# Patient Record
Sex: Male | Born: 1985
Health system: Southern US, Community
[De-identification: ages and names within clinical notes are randomized; demographics above are authoritative.]

## PROBLEM LIST (undated history)

## (undated) DIAGNOSIS — E739 Lactose intolerance, unspecified: Secondary | ICD-10-CM

## (undated) DIAGNOSIS — I10 Essential (primary) hypertension: Secondary | ICD-10-CM

## (undated) DIAGNOSIS — R7303 Prediabetes: Secondary | ICD-10-CM

## (undated) DIAGNOSIS — M549 Dorsalgia, unspecified: Secondary | ICD-10-CM

## (undated) HISTORY — DX: Dorsalgia, unspecified: M54.9

## (undated) HISTORY — DX: Prediabetes: R73.03

## (undated) HISTORY — DX: Essential (primary) hypertension: I10

## (undated) HISTORY — DX: Lactose intolerance, unspecified: E73.9

---

## 2019-02-13 ENCOUNTER — Encounter (HOSPITAL_COMMUNITY): Payer: Self-pay | Admitting: Emergency Medicine

## 2019-02-13 ENCOUNTER — Ambulatory Visit (HOSPITAL_COMMUNITY)
Admission: EM | Admit: 2019-02-13 | Discharge: 2019-02-13 | Disposition: A | Discharge status: Home / Self Care | Payer: 59 | Attending: Family Medicine | Admitting: Family Medicine

## 2019-02-13 ENCOUNTER — Other Ambulatory Visit: Payer: Self-pay

## 2019-02-13 DIAGNOSIS — M791 Myalgia, unspecified site: Secondary | ICD-10-CM

## 2019-02-13 MED ORDER — HYDROCODONE-ACETAMINOPHEN 5-325 MG PO TABS: 1.0000 | ORAL_TABLET | Freq: Four times a day (QID) | ORAL | 0 refills | Status: DC | PRN

## 2019-02-13 MED ORDER — PREDNISONE 20 MG PO TABS: ORAL_TABLET | ORAL | 0 refills | Status: DC

## 2019-02-13 NOTE — ED Triage Notes (Signed)
Pt presents to [facility] for assessment of bilateral lower and upper arm pain starting 2 weeks.  Denies known injury.  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

## 2019-02-13 NOTE — ED Provider Notes (Signed)
[facility]    CSN: [id number] Arrival date & time: 02/13/19  1820     History   Chief Complaint Chief Complaint  Patient presents with  . Arm Pain    HPI Jared Werner is a 33 y.o. male.   Initial [facility] visit  33 yo man [facility] assessment of bilateral lower and upper arm pain starting 2 weeks.  Denies known injury.  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    Patient states that both forearms over the radial radialis area are becoming increasingly painful and is having difficulty sleeping.  He has been doing the job for over a year.  He is also noticing some numbness in his hands and swelling there as well, worse in the morning.  There is been no trauma.  Patient is right-handed.       History reviewed. No pertinent past medical history.  There are no active problems to display for this patient.   History reviewed. No pertinent surgical history.     Home Medications    Prior to Admission medications   Medication Sig Start Date End Date Taking? Authorizing Provider  HYDROcodone-acetaminophen (NORCO) 5-325 MG tablet Take 1 tablet by mouth every 6 (six) hours as needed for moderate pain. 02/13/19   Elvina SidleLauenstein, Dallon Dacosta, MD  predniSONE (DELTASONE) 20 MG tablet Two daily with food 02/13/19   Elvina SidleLauenstein, Amil Moseman, MD    Family History No family history on file.  Social History Social History   Tobacco Use  . Smoking status: Never Smoker  . Smokeless tobacco: Never Used  Substance Use Topics  . Alcohol use: Not Currently  . Drug use: Never     Allergies   Patient has no known allergies.   Review of Systems Review of Systems  Musculoskeletal: Positive for myalgias.  Neurological: Positive for numbness. Negative for weakness.  All other systems reviewed and are negative.    Physical Exam Triage Vital Signs ED Triage Vitals  Enc Vitals Group     BP 02/13/19 1840 (!) 155/90     Pulse Rate 02/13/19 1840 96     Resp 02/13/19  1840 18     Temp 02/13/19 1840 99 F (37.2 C)     Temp Source 02/13/19 1840 Oral     SpO2 02/13/19 1840 97 %     Weight --      Height --      Head Circumference --      Peak Flow --      Pain Score 02/13/19 1838 3     Pain Loc --      Pain Edu? --      Excl. in GC? --    No data found.  Updated Vital Signs BP (!) 155/90 (BP Location: Right Arm)   Pulse 96   Temp 99 F (37.2 C) (Oral)   Resp 18   SpO2 97%    Physical Exam Vitals signs and nursing note reviewed.  Constitutional:      Appearance: Normal appearance. He is obese.  HENT:     Mouth/Throat:     Mouth: Mucous membranes are moist.  Eyes:     Conjunctiva/sclera: Conjunctivae normal.  Neck:     Musculoskeletal: Normal range of motion and neck supple.  Pulmonary:     Effort: Pulmonary effort is normal.  Musculoskeletal: Normal range of motion.        General: Tenderness present. No swelling, deformity or signs of injury.  Comments: Tender brachioradialis bilaterally  Skin:    General: Skin is warm and dry.  Neurological:     General: No focal deficit present.     Mental Status: He is alert and oriented to person, place, and time.  Psychiatric:        Mood and Affect: Mood normal.      UC Treatments / Results  Labs (all labs ordered are listed, but only abnormal results are displayed) Labs Reviewed - No data to display  EKG None  Radiology No results found.  Procedures Procedures (including critical care time)  Medications Ordered in UC Medications - No data to display  Initial Impression / Assessment and Plan / UC Course  I have reviewed the triage vital signs and the nursing notes.  Pertinent labs & imaging results that were available during my care of the patient were reviewed by me and considered in my medical decision making (see chart for details).    Final Clinical Impressions(s) / UC Diagnoses   Final diagnoses:  Myalgia     Discharge Instructions     I believe the  pain is secondary to muscle soreness and swelling, which then compresses the underlying nerves and veins.  This is causing the numbness and hand swelling  You should see improvement in 2 days.  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   Return if pain just is not improving.  Use the pain medicine to allow sleep    ED Prescriptions    Medication Sig Dispense Auth. Provider   predniSONE (DELTASONE) 20 MG tablet Two daily with food 10 tablet Elvina Sidle, MD   HYDROcodone-acetaminophen (NORCO) 5-325 MG tablet Take 1 tablet by mouth every 6 (six) hours as needed for moderate pain. 12 tablet Elvina Sidle, MD     Controlled Substance Prescriptions [place] Controlled Substance Registry consulted? Not Applicable   Elvina Sidle, MD 02/13/19 [phone]

## 2019-02-13 NOTE — Discharge Instructions (Addendum)
I believe the pain is secondary to muscle soreness and swelling, which then compresses the underlying nerves and veins.  This is causing the numbness and hand swelling  You should see improvement in 2 days.  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   Return if pain just is not improving.  Use the pain medicine to allow sleep

## 2019-10-22 ENCOUNTER — Ambulatory Visit (INDEPENDENT_AMBULATORY_CARE_PROVIDER_SITE_OTHER): Payer: 59 | Admitting: Family Medicine

## 2019-10-22 ENCOUNTER — Encounter (INDEPENDENT_AMBULATORY_CARE_PROVIDER_SITE_OTHER): Payer: Self-pay | Admitting: Family Medicine

## 2019-10-22 ENCOUNTER — Other Ambulatory Visit: Payer: Self-pay

## 2019-10-22 VITALS — BP 138/87 | HR 87 | Temp 98.1°F | Ht 70.0 in | Wt 297.0 lb

## 2019-10-22 DIAGNOSIS — R5383 Other fatigue: Secondary | ICD-10-CM

## 2019-10-22 DIAGNOSIS — Z1331 Encounter for screening for depression: Secondary | ICD-10-CM | POA: Diagnosis not present

## 2019-10-22 DIAGNOSIS — Z9189 Other specified personal risk factors, not elsewhere classified: Secondary | ICD-10-CM | POA: Diagnosis not present

## 2019-10-22 DIAGNOSIS — R030 Elevated blood-pressure reading, without diagnosis of hypertension: Secondary | ICD-10-CM | POA: Diagnosis not present

## 2019-10-22 DIAGNOSIS — Z0289 Encounter for other administrative examinations: Secondary | ICD-10-CM

## 2019-10-22 DIAGNOSIS — Z6841 Body Mass Index (BMI) 40.0 and over, adult: Secondary | ICD-10-CM

## 2019-10-22 DIAGNOSIS — R7303 Prediabetes: Secondary | ICD-10-CM | POA: Diagnosis not present

## 2019-10-22 DIAGNOSIS — R0602 Shortness of breath: Secondary | ICD-10-CM | POA: Diagnosis not present

## 2019-10-23 LAB — COMPREHENSIVE METABOLIC PANEL
ALT: 41 IU/L (ref 0–44)
AST: 24 IU/L (ref 0–40)
Albumin/Globulin Ratio: 1.5 (ref 1.2–2.2)
Albumin: 4.6 g/dL (ref 4.0–5.0)
Alkaline Phosphatase: 104 IU/L (ref 39–117)
BUN/Creatinine Ratio: 11 (ref 9–20)
BUN: 9 mg/dL (ref 6–20)
Bilirubin Total: 0.5 mg/dL (ref 0.0–1.2)
CO2: 24 mmol/L (ref 20–29)
Calcium: 9.3 mg/dL (ref 8.7–10.2)
Chloride: 102 mmol/L (ref 96–106)
Creatinine, Ser: 0.82 mg/dL (ref 0.76–1.27)
GFR calc Af Amer: 134 mL/min/{1.73_m2} (ref >59)
GFR calc non Af Amer: 116 mL/min/{1.73_m2} (ref >59)
Globulin, Total: 3.1 g/dL (ref 1.5–4.5)
Glucose: 88 mg/dL (ref 65–99)
Potassium: 4.1 mmol/L (ref 3.5–5.2)
Sodium: 141 mmol/L (ref 134–144)
Total Protein: 7.7 g/dL (ref 6.0–8.5)

## 2019-10-23 LAB — CBC WITH DIFFERENTIAL/PLATELET
Basophils Absolute: 0 10*3/uL (ref 0.0–0.2)
Basos: 0 %
EOS (ABSOLUTE): 0.1 10*3/uL (ref 0.0–0.4)
Eos: 1 %
Hematocrit: 45.5 % (ref 37.5–51.0)
Hemoglobin: 16.2 g/dL (ref 13.0–17.7)
Immature Grans (Abs): 0 10*3/uL (ref 0.0–0.1)
Immature Granulocytes: 1 %
Lymphocytes Absolute: 3 10*3/uL (ref 0.7–3.1)
Lymphs: 34 %
MCH: 29.8 pg (ref 26.6–33.0)
MCHC: 35.6 g/dL (ref 31.5–35.7)
MCV: 84 fL (ref 79–97)
Monocytes Absolute: 0.5 10*3/uL (ref 0.1–0.9)
Monocytes: 6 %
Neutrophils Absolute: 5.1 10*3/uL (ref 1.4–7.0)
Neutrophils: 58 %
Platelets: 263 10*3/uL (ref 150–450)
RBC: 5.43 x10E6/uL (ref 4.14–5.80)
RDW: 13.3 % (ref 11.6–15.4)
WBC: 8.9 10*3/uL (ref 3.4–10.8)

## 2019-10-23 LAB — HEMOGLOBIN A1C
Est. average glucose Bld gHb Est-mCnc: 123 mg/dL
Hgb A1c MFr Bld: 5.9 % — ABNORMAL HIGH (ref 4.8–5.6)

## 2019-10-23 LAB — TSH: TSH: 0.746 u[IU]/mL (ref 0.450–4.500)

## 2019-10-23 LAB — T4, FREE: Free T4: 1.15 ng/dL (ref 0.82–1.77)

## 2019-10-23 LAB — LIPID PANEL WITH LDL/HDL RATIO
Cholesterol, Total: 136 mg/dL (ref 100–199)
HDL: 32 mg/dL — ABNORMAL LOW (ref >39)
LDL Chol Calc (NIH): 77 mg/dL (ref 0–99)
LDL/HDL Ratio: 2.4 ratio (ref 0.0–3.6)
Triglycerides: 155 mg/dL — ABNORMAL HIGH (ref 0–149)
VLDL Cholesterol Cal: 27 mg/dL (ref 5–40)

## 2019-10-23 LAB — T3: T3, Total: 106 ng/dL (ref 71–180)

## 2019-10-23 LAB — VITAMIN B12: Vitamin B-12: 691 pg/mL (ref 232–1245)

## 2019-10-23 LAB — FOLATE: Folate: 7.9 ng/mL (ref >3.0)

## 2019-10-23 LAB — VITAMIN D 25 HYDROXY (VIT D DEFICIENCY, FRACTURES): Vit D, 25-Hydroxy: 11.2 ng/mL — ABNORMAL LOW (ref 30.0–100.0)

## 2019-10-23 LAB — INSULIN, RANDOM: INSULIN: 49 u[IU]/mL — ABNORMAL HIGH (ref 2.6–24.9)

## 2019-10-23 NOTE — Progress Notes (Signed)
Dear Ferd Hibbs, NP,   Thank you for referring Nitesh Pitstick to our clinic. The following note includes my evaluation and treatment recommendations.  Chief Complaint:   OBESITY Lekendrick Alpern (MR# [id number]) is a 34 y.o. male who presents for evaluation and treatment of obesity and related comorbidities. Current BMI is Body mass index is 42.62 kg/m.Marland Kitchen Damaso has been struggling with his weight for many years and has been unsuccessful in either losing weight, maintaining weight loss, or reaching his healthy weight goal.  Daiden is currently in the action stage of change and ready to dedicate time achieving and maintaining a healthier weight. Keshun is interested in becoming our patient and working on intensive lifestyle modifications including (but not limited to) diet and exercise for weight loss.  Kadarius is lactose intolerant. She has coffee with [place] Vanilla cream +/- soda. For lunch she has a sandwich with 3 slices of ham, 1 slice of cheese with mayo, mustard, sun chips +/- cookies/smores or peanut butter [employer] cookies (satisfied). For a snack she will have fruit or pasty. For dinner she will have rice, 5 ounces of meat, and 1/2 cup of vegetables.  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  Depression Screen Whitney's Food and Mood (modified PHQ-9) score was 11.  Depression screen PHQ 2/9 10/22/2019  Decreased Interest 1  Down, Depressed, Hopeless 2  PHQ - 2 Score 3  Altered  sleeping 2  Tired, decreased energy 3  Change in appetite 0  Feeling bad or failure about yourself  0  Trouble concentrating 1  Moving slowly or fidgety/restless 2  Suicidal thoughts 0  PHQ-9 Score 11  Difficult doing work/chores Somewhat difficult   Subjective:   Other fatigue. Markeith admits to daytime somnolence and admits to waking up still tired. Patent has a history of symptoms of daytime fatigue and morning headache. Devell generally gets 5-7 hours of sleep per night, and states that he sometimes has restful sleep. Snoring is present. Apneic episodes are not present. Epworth Sleepiness Score is 8.  Shortness of breath on exertion. Danta notes increasing shortness of breath with exercising and seems to be worsening over time with weight gain. He notes getting out of breath sooner with activity than he used to. This has gotten worse recently. Monico denies shortness of breath at rest or orthopnea.  Elevated blood pressure reading. Blood pressure is controlled today, but previously elevated at >150/>90. No chest pain, chest pressure, or headache.  BP Readings from Last 3 Encounters:  10/22/19 138/87  02/13/19 (!) 155/90   Prediabetes. Viviano has a diagnosis of prediabetes (diagnosed for unsure amount of time) based on his elevated HgA1c and was informed this puts him at greater risk of developing diabetes. He continues to work on diet and exercise to decrease his risk of diabetes. He denies nausea or hypoglycemia. Gloyd is on metformin 1000 mg daily.  Lab Results  Component Value Date   HGBA1C 5.9 (H) 10/22/2019   Lab Results  Component Value Date  INSULIN WILL FOLLOW 10/22/2019   At risk for diabetes mellitus. Redford is at higher than average risk for developing diabetes due to his obesity.   Depression screening. Tharun has a moderately positive depression screen today with a PHQ-9 score of 11.  Assessment/Plan:   Other fatigue. Yuta does feel that  his weight is causing his energy to be lower than it should be. Fatigue may be related to obesity, depression or many other causes. Labs will be ordered, and in the meanwhile, Caspar will focus on self care including making healthy food choices, increasing physical activity and focusing on stress reduction. EKG 12-Lead showed T wave inversion I, II (no prior EKG to compare to). Vitamin B12, CBC with Differential/Platelet, VITAMIN D 25 Hydroxy (Vit-D Deficiency, Fractures), T3, T4, free, TSH, Lipid Panel With LDL/HDL Ratio, Folate labs ordered.  Shortness of breath on exertion. Kaleb does feel that he gets out of breath more easily that he used to when he exercises. Khiry's shortness of breath appears to be obesity related and exercise induced. He has agreed to work on weight loss and gradually increase exercise to treat his exercise induced shortness of breath. Will continue to monitor closely.  Elevated blood pressure reading. Gabriela is working on healthy weight loss and exercise to improve blood pressure control. We will watch for signs of hypotension as he continues his lifestyle modifications. EKG and Comprehensive metabolic panel ordered. Will refer to Cardiology.  Prediabetes. Lerone will continue to work on weight loss, exercise, and decreasing simple carbohydrates to help decrease the risk of diabetes. Comprehensive metabolic panel, Hemoglobin A1c, Insulin, random labs ordered today.  At risk for diabetes mellitus. Brogan was given approximately 15 minutes of diabetes education and counseling today. We discussed intensive lifestyle modifications today with an emphasis on weight loss as well as increasing exercise and decreasing simple carbohydrates in his diet. We also reviewed medication options with an emphasis on risk versus benefit of those discussed.   Repetitive spaced learning was employed today to elicit superior memory formation and behavioral change.  Depression screening.  Danile had a positive depression screening. Depression is commonly associated with obesity and often results in emotional eating behaviors. We will monitor this closely and work on CBT to help improve the non-hunger eating patterns. Referral to Psychology may be required if no improvement is seen as he continues in our clinic.  Class 3 severe obesity with serious comorbidity and body mass index (BMI) of 40.0 to 44.9 in adult, unspecified obesity type (HCC).  Bowe is currently in the action stage of change and his goal is to continue with weight loss efforts. I recommend Claxton begin the structured treatment plan as follows:  He has agreed to the Category 3 Plan.  Exercise goals: For substantial health benefits, adults should do at least 150 minutes (2 hours and 30 minutes) a week of moderate-intensity, or 75 minutes (1 hour and 15 minutes) a week of vigorous-intensity aerobic physical activity, or an equivalent combination of moderate- and vigorous-intensity aerobic activity. Aerobic activity should be performed in episodes of at least 10 minutes, and preferably, it should be spread throughout the week.   Behavioral modification strategies: increasing lean protein intake, increasing vegetables, meal planning and cooking strategies, keeping healthy foods in the home and planning for success.  He was informed of the importance of frequent follow-up visits to maximize his success with intensive lifestyle modifications for his multiple health conditions. He was informed we would discuss his lab results at his next  visit unless there is a critical issue that needs to be addressed sooner. Gaelan agreed to keep his next visit at the agreed upon time to discuss these results.  Objective:   Blood pressure 138/87, pulse 87, temperature 98.1 F (36.7 C), temperature source Oral, height 5\' 10"  (1.778 m), weight 297 lb (134.7 kg), SpO2 97 %. Body mass index is 42.62 kg/m.  EKG: Atrial  Rhythm with a  rate of 69 BPM. P:QRS - 1:1, Abnormal P axis, H Rate 85 -  Negative T-waves. May be normal for age - consider acute process. Borderline.   Indirect Calorimeter completed today shows a VO2 of 335 and a REE of 2330.  His calculated basal metabolic rate is thus his basal metabolic rate is worse than expected.  General: Cooperative, alert, well developed, in no acute distress. HEENT: Conjunctivae and lids unremarkable. Cardiovascular: Regular rhythm.  Lungs: Normal work of breathing. Neurologic: No focal deficits.   No results found for: CREATININE, BUN, NA, K, CL, CO2 No results found for: ALT, AST, GGT, ALKPHOS, BILITOT No results found for: HGBA1C No results found for: INSULIN No results found for: TSH No results found for: CHOL, HDL, LDLCALC, LDLDIRECT, TRIG, CHOLHDL No results found for: WBC, HGB, HCT, MCV, PLT No results found for: IRON, TIBC, FERRITIN  Attestation Statements:   Reviewed by clinician on day of visit: allergies, medications, problem list, medical history, surgical history, family history, social history, and previous encounter notes.  I, [id number], am acting as transcriptionist for Marianna Payment, MD   I have reviewed the above documentation for accuracy and completeness, and I agree with the above. - Debbra Riding, MD

## 2019-11-05 ENCOUNTER — Encounter (INDEPENDENT_AMBULATORY_CARE_PROVIDER_SITE_OTHER): Payer: Self-pay | Admitting: Family Medicine

## 2019-11-05 ENCOUNTER — Ambulatory Visit (INDEPENDENT_AMBULATORY_CARE_PROVIDER_SITE_OTHER): Payer: 59 | Admitting: Family Medicine

## 2019-11-05 ENCOUNTER — Other Ambulatory Visit: Payer: Self-pay

## 2019-11-05 VITALS — BP 119/79 | HR 97 | Temp 98.4°F | Ht 70.0 in | Wt 290.0 lb

## 2019-11-05 DIAGNOSIS — Z9189 Other specified personal risk factors, not elsewhere classified: Secondary | ICD-10-CM

## 2019-11-05 DIAGNOSIS — E559 Vitamin D deficiency, unspecified: Secondary | ICD-10-CM | POA: Diagnosis not present

## 2019-11-05 DIAGNOSIS — R7303 Prediabetes: Secondary | ICD-10-CM | POA: Diagnosis not present

## 2019-11-05 DIAGNOSIS — Z6841 Body Mass Index (BMI) 40.0 and over, adult: Secondary | ICD-10-CM

## 2019-11-05 DIAGNOSIS — E781 Pure hyperglyceridemia: Secondary | ICD-10-CM | POA: Diagnosis not present

## 2019-11-05 MED ORDER — VITAMIN D (ERGOCALCIFEROL) 1.25 MG (50000 UNIT) PO CAPS: 50000.0000 [IU] | ORAL_CAPSULE | ORAL | 0 refills | Status: DC

## 2019-11-06 NOTE — Progress Notes (Signed)
Chief Complaint:   OBESITY Jared Werner is here to discuss his progress with his obesity treatment plan along with follow-up of his obesity related diagnoses. Jared Werner is on the Category 3 Plan and states he is following his eating plan approximately 80% of the time. Jared Werner states he is exercising 0 minutes 0 times per week.  Today's visit was #: 2 Starting weight: 297 lbs Starting date: 10/22/2019 Today's weight: 290 lbs Today's date: 11/05/2019 Total lbs lost to date: 7 Total lbs lost since last in-office visit: 7  Interim History: Jared Werner's first two days were difficult with the quantity of food. She is getting eight to ten ounces of protein at night. Brain is doing a sandwich at lunch, and four ounces of meat, three eggs for breakfast with bread and eight ounces of Fair[employer] chocolate milk. For snacks, ?Jared Werner is doing apples or pears, string cheese and a Fiber One brownie. He has no cravings. Jared Werner had two indulgent episodes of eating. He doesn't like cottage cheese.  Subjective:   Vitamin D deficiency  Jared Werner's Vitamin D level was 11.2 on 10/22/19. He is not on vit D supplementation. He admits fatigue.  Hypertriglyceridemia Jared Werner's triglycerides are 155 (10/22/19). His HDL and LDL are within normal limits.  Prediabetes Jared Werner has a diagnosis of prediabetes and he is on metformin 1,000 mg daily. He has no GI side effects. His last Hgb A1c was 5.9 and last insulin level was 49.0 (10/22/19). He continues to work on diet and exercise to decrease his risk of diabetes.   Lab Results  Component Value Date   HGBA1C 5.9 (H) 10/22/2019   Lab Results  Component Value Date   INSULIN 49.0 (H) 10/22/2019   At risk for diabetes mellitus Jared Werner is at higher than average risk for developing diabetes due to his obesity and prediabetes.   Assessment/Plan:   Vitamin D deficiency  Low Vitamin D level contributes to fatigue and are associated with obesity, breast, and  colon cancer. Jared Werner agrees to start prescription Vitamin D @50 ,000 IU every week #4 with no refills and he will follow-up for routine testing of Vitamin D, at least 2-3 times per year to avoid over-replacement.  Hypertriglyceridemia We will retest labs in 3 months. Jared Werner will continue to work on diet, exercise and weight loss efforts. Orders and follow up as documented in patient record.   Prediabetes Jared Werner will continue metformin at the current dose. We will repeat labs in 3 months. He will continue to work on weight loss, exercise, and decreasing simple carbohydrates to help decrease the risk of diabetes.   At risk for diabetes mellitus Jared Werner was given approximately 15 minutes of diabetes education and counseling today. We discussed intensive lifestyle modifications today with an emphasis on weight loss as well as increasing exercise and decreasing simple carbohydrates in his diet. We also reviewed medication options with an emphasis on risk versus benefit of those discussed.   Repetitive spaced learning was employed today to elicit superior memory formation and behavioral change.  Class 3 severe obesity with serious comorbidity and body mass index (BMI) of 40.0 to 44.9 in adult, unspecified obesity type (HCC) Jared Werner is currently in the action stage of change. As such, his goal is to continue with weight loss efforts. He has agreed to the Category 3 Plan.   Behavioral modification strategies: increasing lean protein intake, increasing vegetables, meal planning and cooking strategies, keeping healthy foods in the home and planning for success.  Jared Werner has agreed to  follow-up with our clinic in 2 weeks. He was informed of the importance of frequent follow-up visits to maximize his success with intensive lifestyle modifications for his multiple health conditions.   Objective:   Blood pressure 119/79, pulse 97, temperature 98.4 F (36.9 C), temperature source Oral, height 5\' 10"   (1.778 m), weight 290 lb (131.5 kg), SpO2 96 %. Body mass index is 41.61 kg/m.  General: Cooperative, alert, well developed, in no acute distress. HEENT: Conjunctivae and lids unremarkable. Cardiovascular: Regular rhythm.  Lungs: Normal work of breathing. Neurologic: No focal deficits.   Lab Results  Component Value Date   CREATININE 0.82 10/22/2019   BUN 9 10/22/2019   NA 141 10/22/2019   K 4.1 10/22/2019   CL 102 10/22/2019   CO2 24 10/22/2019   Lab Results  Component Value Date   ALT 41 10/22/2019   AST 24 10/22/2019   ALKPHOS 104 10/22/2019   BILITOT 0.5 10/22/2019   Lab Results  Component Value Date   HGBA1C 5.9 (H) 10/22/2019   Lab Results  Component Value Date   INSULIN 49.0 (H) 10/22/2019   Lab Results  Component Value Date   TSH 0.746 10/22/2019   Lab Results  Component Value Date   CHOL 136 10/22/2019   HDL 32 (L) 10/22/2019   LDLCALC 77 10/22/2019   TRIG 155 (H) 10/22/2019   Lab Results  Component Value Date   WBC 8.9 10/22/2019   HGB 16.2 10/22/2019   HCT 45.5 10/22/2019   MCV 84 10/22/2019   PLT 263 10/22/2019   No results found for: IRON, TIBC, FERRITIN   Ref. Range 10/22/2019 12:46  Vitamin D, 25-Hydroxy Latest Ref Range: 30.0 - 100.0 ng/mL 11.2 (L)    Attestation Statements:   Reviewed by clinician on day of visit: allergies, medications, problem list, medical history, surgical history, family history, social history, and previous encounter notes.  I, 12/20/2019, am acting as transcriptionist for Jared Crane, MD.  I have reviewed the above documentation for accuracy and completeness, and I agree with the above. - Jared Likes, MD

## 2019-11-22 ENCOUNTER — Encounter (INDEPENDENT_AMBULATORY_CARE_PROVIDER_SITE_OTHER): Payer: Self-pay | Admitting: Family Medicine

## 2019-11-22 ENCOUNTER — Ambulatory Visit (INDEPENDENT_AMBULATORY_CARE_PROVIDER_SITE_OTHER): Payer: 59 | Admitting: Family Medicine

## 2019-11-22 ENCOUNTER — Other Ambulatory Visit: Payer: Self-pay

## 2019-11-22 VITALS — BP 128/79 | HR 66 | Temp 97.8°F | Ht 70.0 in | Wt 284.0 lb

## 2019-11-22 DIAGNOSIS — E559 Vitamin D deficiency, unspecified: Secondary | ICD-10-CM

## 2019-11-22 DIAGNOSIS — Z9189 Other specified personal risk factors, not elsewhere classified: Secondary | ICD-10-CM

## 2019-11-22 DIAGNOSIS — R7303 Prediabetes: Secondary | ICD-10-CM

## 2019-11-22 DIAGNOSIS — Z6841 Body Mass Index (BMI) 40.0 and over, adult: Secondary | ICD-10-CM

## 2019-11-22 MED ORDER — VITAMIN D (ERGOCALCIFEROL) 1.25 MG (50000 UNIT) PO CAPS: 50000.0000 [IU] | ORAL_CAPSULE | ORAL | 0 refills | Status: DC

## 2019-11-22 NOTE — Progress Notes (Signed)
Chief Complaint:   OBESITY Jared Werner is here to discuss his progress with his obesity treatment plan along with follow-up of his obesity related diagnoses. [name] is on the Category 3 Plan and states he is following his eating plan approximately 80% of the time. [name] states he is just working.   Today's visit was #: 3 Starting weight: 297 lbs Starting date: 10/22/2019 Today's weight: 284 lbs Today's date: 11/22/2019 Total lbs lost to date: 13 Total lbs lost since last in-office visit: 6  Interim History: Jared Werner feels the meal plan has gotten easier. He reports occasional hunger and very few cravings. He is eating an egg for breakfast and sometimes yogurt; is having a sandwich for lunch; and is eating 8-10 ounces of meat at dinner. For snacks he is doing 100 calorie options. He is going to [place] for a day for work and possibly changing jobs in the next 2 weeks. He does voice he is going to the beach in June.  Subjective:   Prediabetes. Jared Werner has a diagnosis of prediabetes based on his elevated HgA1c and was informed this puts him at greater risk of developing diabetes. He continues to work on diet and exercise to decrease his risk of diabetes. He denies nausea or hypoglycemia. Jared Werner denies GI side effects of metformin. He reports still having occasional cravings.  Lab Results  Component Value Date   HGBA1C 5.9 (H) 10/22/2019   Lab Results  Component Value Date   INSULIN 49.0 (H) 10/22/2019   Vitamin D deficiency. No nausea, vomiting, or muscle weakness. He admits to fatigue. Last Vitamin D 11.2 on 10/22/2019.  At risk for osteoporosis. Jared Werner is at higher risk of osteopenia and osteoporosis due to Vitamin D deficiency.   Assessment/Plan:   Prediabetes. Jared Werner will continue to work on weight loss, exercise, and decreasing simple carbohydrates to help decrease the risk of diabetes. He will continue metformin as directed.  Vitamin D deficiency.  Low Vitamin D level contributes to fatigue and are associated with obesity, breast, and colon cancer. He was given a refill on his Vitamin D, Ergocalciferol, (DRISDOL) 1.25 MG (50000 UNIT) CAPS capsule every week #4 with 0 refills and will follow-up for routine testing of Vitamin D, at least 2-3 times per year to avoid over-replacement.   At risk for osteoporosis. [place] was given approximately 15 minutes of osteoporosis prevention counseling today. Jared Werner is at risk for osteopenia and osteoporosis due to his Vitamin D deficiency. He was encouraged to take his Vitamin D and follow his higher calcium diet and increase strengthening exercise to help strengthen his bones and decrease his risk of osteopenia and osteoporosis.  Repetitive spaced learning was employed today to elicit superior memory formation and behavioral change.  Class 3 severe obesity with serious comorbidity and body mass index (BMI) of 40.0 to 44.9 in adult, unspecified obesity type (HCC).  Jared Werner is currently in the action stage of change. As such, his goal is to continue with weight loss efforts. He has agreed to the Category 3 Plan.   Exercise goals: Jared Werner will continue his current exercise regimen.  Behavioral modification strategies: increasing lean protein intake, increasing vegetables, meal planning and cooking strategies, keeping healthy foods in the home and planning for success.  Jared Werner has agreed to follow-up with our clinic in 2 weeks. He was informed of the importance of frequent follow-up visits to maximize his success with intensive lifestyle modifications for his multiple health conditions.   Objective:   Blood  pressure 128/79, pulse 66, temperature 97.8 F (36.6 C), temperature source Oral, height 5\' 10"  (1.778 m), weight 284 lb (128.8 kg), SpO2 96 %. Body mass index is 40.75 kg/m.  General: Cooperative, alert, well developed, in no acute distress. HEENT: Conjunctivae and lids  unremarkable. Cardiovascular: Regular rhythm.  Lungs: Normal work of breathing. Neurologic: No focal deficits.   Lab Results  Component Value Date   CREATININE 0.82 10/22/2019   BUN 9 10/22/2019   NA 141 10/22/2019   K 4.1 10/22/2019   CL 102 10/22/2019   CO2 24 10/22/2019   Lab Results  Component Value Date   ALT 41 10/22/2019   AST 24 10/22/2019   ALKPHOS 104 10/22/2019   BILITOT 0.5 10/22/2019   Lab Results  Component Value Date   HGBA1C 5.9 (H) 10/22/2019   Lab Results  Component Value Date   INSULIN 49.0 (H) 10/22/2019   Lab Results  Component Value Date   TSH 0.746 10/22/2019   Lab Results  Component Value Date   CHOL 136 10/22/2019   HDL 32 (L) 10/22/2019   LDLCALC 77 10/22/2019   TRIG 155 (H) 10/22/2019   Lab Results  Component Value Date   WBC 8.9 10/22/2019   HGB 16.2 10/22/2019   HCT 45.5 10/22/2019   MCV 84 10/22/2019   PLT 263 10/22/2019   No results found for: IRON, TIBC, FERRITIN to schedule his next follow up appointment today.  Attestation Statements:   Reviewed by clinician on day of visit: allergies, medications, problem list, medical history, surgical history, family history, social history, and previous encounter notes.  I, Michaelene Song, am acting as transcriptionist for Coralie Common, MD   I have reviewed the above documentation for accuracy and completeness, and I agree with the above. - Ilene Qua, MD

## 2019-12-06 ENCOUNTER — Other Ambulatory Visit: Payer: Self-pay

## 2019-12-06 ENCOUNTER — Ambulatory Visit (INDEPENDENT_AMBULATORY_CARE_PROVIDER_SITE_OTHER): Payer: 59 | Admitting: Family Medicine

## 2019-12-06 ENCOUNTER — Encounter (INDEPENDENT_AMBULATORY_CARE_PROVIDER_SITE_OTHER): Payer: Self-pay | Admitting: Family Medicine

## 2019-12-06 VITALS — BP 129/77 | HR 89 | Temp 98.3°F | Ht 70.0 in | Wt 281.0 lb

## 2019-12-06 DIAGNOSIS — Z6841 Body Mass Index (BMI) 40.0 and over, adult: Secondary | ICD-10-CM

## 2019-12-06 DIAGNOSIS — R7303 Prediabetes: Secondary | ICD-10-CM

## 2019-12-06 DIAGNOSIS — E559 Vitamin D deficiency, unspecified: Secondary | ICD-10-CM

## 2019-12-09 ENCOUNTER — Encounter (INDEPENDENT_AMBULATORY_CARE_PROVIDER_SITE_OTHER): Payer: Self-pay | Admitting: Family Medicine

## 2019-12-10 NOTE — Telephone Encounter (Signed)
Please advise 

## 2019-12-10 NOTE — Progress Notes (Signed)
Chief Complaint:   OBESITY Hampton Wixom is here to discuss his progress with his obesity treatment plan along with follow-up of his obesity related diagnoses. Xsavier is on the Category 3 Plan and states he is following his eating plan approximately 80-90% of the time. Brendyn states he is walkinfg 15-30 minutes 2-3 times per week.  Today's visit was #: 4 Starting weight: 297 lbs Starting date: 10/22/2019 Today's weight: 281 lbs Today's date: 12/06/2019 Total lbs lost to date: 13 Total lbs lost since last in-office visit: 3  Interim History: Nakoa worked out-of-town a bit in the last few weeks. He is starting to get hungry. He reports eating between 8-10 oz at dinner and doing 100 calorie snacks. He thinks it is doable to continue a structured plan. He is changing jobs and is not sure about insurance coverage.  Subjective:   Prediabetes. Rommel has a diagnosis of prediabetes based on his elevated HgA1c and was informed this puts him at greater risk of developing diabetes. He continues to work on diet and exercise to decrease his risk of diabetes. He denies nausea or hypoglycemia. Natalio is on metformin 1000 mg. He states he has forgotten to take metformin this week.  Lab Results  Component Value Date   HGBA1C 5.9 (H) 10/22/2019   Lab Results  Component Value Date   INSULIN 49.0 (H) 10/22/2019   Vitamin D deficiency. No nausea, vomiting, or muscle weakness. He is on prescription Vitamin D. Last Vitamin D 11.2 on 10/22/2019.  Assessment/Plan:   Prediabetes. Jumaane will continue to work on weight loss, exercise, and decreasing simple carbohydrates to help decrease the risk of diabetes. He will continue metformin 1000 mg as directed (no refill needed).  Vitamin D deficiency. Low Vitamin D level contributes to fatigue and are associated with obesity, breast, and colon cancer. He agrees to continue to take prescription Vitamin D @50 ,000 IU every week (no refill  needed) and will follow-up for routine testing of Vitamin D, at least 2-3 times per year to avoid over-replacement.  Class 3 severe obesity with serious comorbidity and body mass index (BMI) of 40.0 to 44.9 in adult, unspecified obesity type ([place]).  Shandy is currently in the action stage of change. As such, his goal is to continue with weight loss efforts. He has agreed to the Category 4 Plan.   Exercise goals: Kamare will continue his current exercise regimen.  Behavioral modification strategies: increasing lean protein intake, increasing vegetables, meal planning and cooking strategies, keeping healthy foods in the home and planning for success.  Amier has agreed to follow-up with our clinic in 3 weeks. He was informed of the importance of frequent follow-up visits to maximize his success with intensive lifestyle modifications for his multiple health conditions.   Objective:   Blood pressure 129/77, pulse 89, temperature 98.3 F (36.8 C), temperature source Oral, height 5\' 10"  (1.778 m), weight 284 lb (128.8 kg), SpO2 97 %. Body mass index is 40.75 kg/m.  General: Cooperative, alert, well developed, in no acute distress. HEENT: Conjunctivae and lids unremarkable. Cardiovascular: Regular rhythm.  Lungs: Normal work of breathing. Neurologic: No focal deficits.   Lab Results  Component Value Date   CREATININE 0.82 10/22/2019   BUN 9 10/22/2019   NA 141 10/22/2019   K 4.1 10/22/2019   CL 102 10/22/2019   CO2 24 10/22/2019   Lab Results  Component Value Date   ALT 41 10/22/2019   AST 24 10/22/2019   ALKPHOS 104 10/22/2019  BILITOT 0.5 10/22/2019   Lab Results  Component Value Date   HGBA1C 5.9 (H) 10/22/2019   Lab Results  Component Value Date   INSULIN 49.0 (H) 10/22/2019   Lab Results  Component Value Date   TSH 0.746 10/22/2019   Lab Results  Component Value Date   CHOL 136 10/22/2019   HDL 32 (L) 10/22/2019   LDLCALC 77 10/22/2019   TRIG 155 (H)  10/22/2019   Lab Results  Component Value Date   WBC 8.9 10/22/2019   HGB 16.2 10/22/2019   HCT 45.5 10/22/2019   MCV 84 10/22/2019   PLT 263 10/22/2019   No results found for: IRON, TIBC, FERRITIN  Attestation Statements:   Reviewed by clinician on day of visit: allergies, medications, problem list, medical history, surgical history, family history, social history, and previous encounter notes.  Time spent on visit including pre-visit chart review and post-visit charting and care was 12 minutes.   I, Marianna Payment, am acting as transcriptionist for Reuben Likes, MD   I have reviewed the above documentation for accuracy and completeness, and I agree with the above. - Debbra Riding, MD

## 2019-12-27 ENCOUNTER — Ambulatory Visit (INDEPENDENT_AMBULATORY_CARE_PROVIDER_SITE_OTHER): Payer: 59 | Admitting: Family Medicine

## 2020-01-22 ENCOUNTER — Ambulatory Visit (INDEPENDENT_AMBULATORY_CARE_PROVIDER_SITE_OTHER): Payer: 59 | Admitting: Family Medicine

## 2020-04-25 ENCOUNTER — Telehealth: Payer: Self-pay | Admitting: Unknown Physician Specialty

## 2020-04-25 ENCOUNTER — Ambulatory Visit
Admission: EM | Admit: 2020-04-25 | Discharge: 2020-04-25 | Disposition: A | Discharge status: Home / Self Care | Payer: Managed Care, Other (non HMO) | Attending: Emergency Medicine | Admitting: Emergency Medicine

## 2020-04-25 DIAGNOSIS — U071 COVID-19: Secondary | ICD-10-CM

## 2020-04-25 LAB — POC SARS CORONAVIRUS 2 AG -  ED: SARS Coronavirus 2 Ag: POSITIVE — AB

## 2020-04-25 NOTE — ED Triage Notes (Signed)
Patient complains of chills x2 days and reports when he brushed his teeth this morning, he could not taste the toothpaste or mouthwash.

## 2020-04-25 NOTE — Discharge Instructions (Addendum)
Your COVID test is positive.    You should self-quarantine for:  *10 days since your symptoms first appeared and  *24 hours with no fever, without the use of fever-reducing medications and  *your other symptoms of COVID are improving.  Most people do not need to be re-tested at the end of the quarantine period.    Go to the emergency department if you have high fever not relieved by Tylenol, shortness of breath, severe diarrhea, or other concerning symptoms.    

## 2020-04-25 NOTE — Telephone Encounter (Signed)
Called to discuss with patient about Covid symptoms and the use of Regeneron a monoclonal antibody infusion for those with mild to moderate Covid symptoms and at a high risk of hospitalization.  Pt is qualified for this infusion at the [facility] infusion center due to [employer] left to call back

## 2020-04-25 NOTE — ED Provider Notes (Addendum)
[facility]    CSN: [id number] Arrival date & time: 04/25/20  1004      History   Chief Complaint Chief Complaint  Patient presents with   Chills   loss of taste/smell   cold sweats    HPI Jared Werner is a 34 y.o. male.   Patient presents with chills x2 days.  T-max 99 at home.  He states this morning when he brushed his teeth he was unable to taste his toothpaste or Listerine mouthwash; he also reports sinus burning sensation, like he had water up his nose.  His wife was COVID + 2 weeks ago.  Patient denies sore throat, cough, shortness of breath, vomiting, diarrhea, rash, or other symptoms.  No treatments attempted at home.  Has not received COVID vaccines.    The history is provided by the patient.    Past Medical History:  Diagnosis Date   Back pain    Hypertension    Lactose intolerance    Prediabetes     There are no problems to display for this patient.   History reviewed. No pertinent surgical history.     Home Medications    Prior to Admission medications   Medication Sig Start Date End Date Taking? Authorizing Provider  metFORMIN (GLUMETZA) 1000 MG (MOD) 24 hr tablet Take 1,000 mg by mouth daily with breakfast.    [provider]  Vitamin D, Ergocalciferol, (DRISDOL) 1.25 MG (50000 UNIT) CAPS capsule Take 1 capsule (50,000 Units total) by mouth every 7 (seven) days. 11/22/19   Filbert Schilder, MD    Family History Family History  Problem Relation Age of Onset   Hyperlipidemia Father     Social History Social History   Tobacco Use   Smoking status: Never Smoker   Smokeless tobacco: Never Used  Substance Use Topics   Alcohol use: Not Currently   Drug use: Never     Allergies   Patient has no known allergies.   Review of Systems Review of Systems  Constitutional: Positive for chills. Negative for fever.  HENT: Positive for sinus pain. Negative for ear pain and sore throat.        Loss of smell    Eyes: Negative for pain and visual disturbance.  Respiratory: Negative for cough and shortness of breath.   Cardiovascular: Negative for chest pain and palpitations.  Gastrointestinal: Negative for abdominal pain, diarrhea and vomiting.  Genitourinary: Negative for dysuria and hematuria.  Musculoskeletal: Negative for arthralgias and back pain.  Skin: Negative for color change and rash.  Neurological: Negative for seizures and syncope.  All other systems reviewed and are negative.    Physical Exam Triage Vital Signs ED Triage Vitals [04/25/20 1006]  Enc Vitals Group     BP      Pulse      Resp      Temp      Temp src      SpO2      Weight      Height      Head Circumference      Peak Flow      Pain Score 0     Pain Loc      Pain Edu?      Excl. in GC?    No data found.  Updated Vital Signs BP (!) 141/91    Pulse 94    Temp 99 F (37.2 C)    Resp 17    SpO2 95%   Visual Acuity  Right Eye Distance:   Left Eye Distance:   Bilateral Distance:    Right Eye Near:   Left Eye Near:    Bilateral Near:     Physical Exam Vitals and nursing note reviewed.  Constitutional:      General: He is not in acute distress.    Appearance: He is well-developed. He is not ill-appearing.  HENT:     Head: Normocephalic and atraumatic.     Right Ear: Tympanic membrane normal.     Left Ear: Tympanic membrane normal.     Nose: Nose normal.     Mouth/Throat:     Mouth: Mucous membranes are moist.     Pharynx: Oropharynx is clear.  Eyes:     Conjunctiva/sclera: Conjunctivae normal.  Cardiovascular:     Rate and Rhythm: Normal rate and regular rhythm.     Heart sounds: No murmur heard.   Pulmonary:     Effort: Pulmonary effort is normal. No respiratory distress.     Breath sounds: Normal breath sounds.  Abdominal:     Palpations: Abdomen is soft.     Tenderness: There is no abdominal tenderness. There is no guarding or rebound.  Musculoskeletal:     Cervical back: Neck  supple.  Skin:    General: Skin is warm and dry.     Findings: No rash.  Neurological:     General: No focal deficit present.     Mental Status: He is alert and oriented to person, place, and time.     Gait: Gait normal.  Psychiatric:        Mood and Affect: Mood normal.        Behavior: Behavior normal.      UC Treatments / Results  Labs (all labs ordered are listed, but only abnormal results are displayed) Labs Reviewed  NOVEL CORONAVIRUS, NAA  POC SARS CORONAVIRUS 2 AG -  ED    EKG   Radiology No results found.  Procedures Procedures (including critical care time)  Medications Ordered in UC Medications - No data to display  Initial Impression / Assessment and Plan / UC Course  I have reviewed the triage vital signs and the nursing notes.  Pertinent labs & imaging results that were available during my care of the patient were reviewed by me and considered in my medical decision making (see chart for details).   COVID-19.  POC COVID positive.  Instructed patient to self quarantine per CDC guidelines.  Referral made to COVID infusion center.  Discussed symptomatic treatment with Tylenol or ibuprofen as needed for fever or discomfort.  Instructed patient to go to the ED if he has acute worsening symptoms.  COVID education provided.  Patient agrees to plan of care.   Final Clinical Impressions(s) / UC Diagnoses   Final diagnoses:  COVID-19     Discharge Instructions     Your COVID test is positive.    You should self-quarantine for:  *10 days since your symptoms first appeared and  *24 hours with no fever, without the use of fever-reducing medications and  *your other symptoms of COVID are improving.  Most people do not need to be re-tested at the end of the quarantine period.    Go to the emergency department if you have high fever not relieved by Tylenol, shortness of breath, severe diarrhea, or other concerning symptoms.       ED Prescriptions     None     PDMP not reviewed this encounter.  Mickie Bail, NP 04/25/20 1036    Mickie Bail, NP 04/25/20 1041

## 2020-04-27 LAB — SARS-COV-2, NAA 2 DAY TAT

## 2020-04-27 LAB — NOVEL CORONAVIRUS, NAA: SARS-CoV-2, NAA: DETECTED — AB

## 2020-05-02 ENCOUNTER — Encounter: Payer: Self-pay | Admitting: Emergency Medicine

## 2020-05-02 ENCOUNTER — Emergency Department: Payer: Managed Care, Other (non HMO)

## 2020-05-02 ENCOUNTER — Other Ambulatory Visit: Payer: Self-pay

## 2020-05-02 ENCOUNTER — Inpatient Hospital Stay
Admission: EM | Admit: 2020-05-02 | Discharge: 2020-05-06 | DRG: 177 | Disposition: A | Discharge status: Home / Self Care | Payer: Managed Care, Other (non HMO) | Attending: Family Medicine | Admitting: Family Medicine

## 2020-05-02 DIAGNOSIS — U071 COVID-19: Secondary | ICD-10-CM | POA: Diagnosis present

## 2020-05-02 DIAGNOSIS — E739 Lactose intolerance, unspecified: Secondary | ICD-10-CM | POA: Diagnosis present

## 2020-05-02 DIAGNOSIS — Z83438 Family history of other disorder of lipoprotein metabolism and other lipidemia: Secondary | ICD-10-CM | POA: Diagnosis not present

## 2020-05-02 DIAGNOSIS — I10 Essential (primary) hypertension: Secondary | ICD-10-CM | POA: Diagnosis present

## 2020-05-02 DIAGNOSIS — Z6839 Body mass index (BMI) 39.0-39.9, adult: Secondary | ICD-10-CM | POA: Diagnosis not present

## 2020-05-02 DIAGNOSIS — R0602 Shortness of breath: Secondary | ICD-10-CM

## 2020-05-02 DIAGNOSIS — R0902 Hypoxemia: Secondary | ICD-10-CM

## 2020-05-02 DIAGNOSIS — R7303 Prediabetes: Secondary | ICD-10-CM | POA: Diagnosis present

## 2020-05-02 DIAGNOSIS — R739 Hyperglycemia, unspecified: Secondary | ICD-10-CM | POA: Diagnosis not present

## 2020-05-02 DIAGNOSIS — R439 Unspecified disturbances of smell and taste: Secondary | ICD-10-CM | POA: Diagnosis present

## 2020-05-02 DIAGNOSIS — A0839 Other viral enteritis: Secondary | ICD-10-CM | POA: Diagnosis present

## 2020-05-02 DIAGNOSIS — E669 Obesity, unspecified: Secondary | ICD-10-CM | POA: Diagnosis present

## 2020-05-02 DIAGNOSIS — J1282 Pneumonia due to coronavirus disease 2019: Secondary | ICD-10-CM | POA: Diagnosis present

## 2020-05-02 DIAGNOSIS — R5081 Fever presenting with conditions classified elsewhere: Secondary | ICD-10-CM

## 2020-05-02 DIAGNOSIS — T380X5A Adverse effect of glucocorticoids and synthetic analogues, initial encounter: Secondary | ICD-10-CM

## 2020-05-02 DIAGNOSIS — J9601 Acute respiratory failure with hypoxia: Secondary | ICD-10-CM

## 2020-05-02 DIAGNOSIS — R7401 Elevation of levels of liver transaminase levels: Secondary | ICD-10-CM | POA: Diagnosis not present

## 2020-05-02 LAB — URINALYSIS, COMPLETE (UACMP) WITH MICROSCOPIC
Bilirubin Urine: NEGATIVE
Glucose, UA: NEGATIVE mg/dL
Hgb urine dipstick: NEGATIVE
Ketones, ur: NEGATIVE mg/dL
Leukocytes,Ua: NEGATIVE
Nitrite: NEGATIVE
Protein, ur: 100 mg/dL — AB
Specific Gravity, Urine: 1.019 (ref 1.005–1.030)
pH: 6 (ref 5.0–8.0)

## 2020-05-02 LAB — CBC WITH DIFFERENTIAL/PLATELET
Abs Immature Granulocytes: 0.05 10*3/uL (ref 0.00–0.07)
Basophils Absolute: 0 10*3/uL (ref 0.0–0.1)
Basophils Relative: 0 %
Eosinophils Absolute: 0 10*3/uL (ref 0.0–0.5)
Eosinophils Relative: 0 %
HCT: 38.9 % — ABNORMAL LOW (ref 39.0–52.0)
Hemoglobin: 13.5 g/dL (ref 13.0–17.0)
Immature Granulocytes: 1 %
Lymphocytes Relative: 22 %
Lymphs Abs: 1.1 10*3/uL (ref 0.7–4.0)
MCH: 29.4 pg (ref 26.0–34.0)
MCHC: 34.7 g/dL (ref 30.0–36.0)
MCV: 84.7 fL (ref 80.0–100.0)
Monocytes Absolute: 0.2 10*3/uL (ref 0.1–1.0)
Monocytes Relative: 5 %
Neutro Abs: 3.6 10*3/uL (ref 1.7–7.7)
Neutrophils Relative %: 72 %
Platelets: 175 10*3/uL (ref 150–400)
RBC: 4.59 MIL/uL (ref 4.22–5.81)
RDW: 12.9 % (ref 11.5–15.5)
WBC: 5 10*3/uL (ref 4.0–10.5)
nRBC: 0 % (ref 0.0–0.2)

## 2020-05-02 LAB — COMPREHENSIVE METABOLIC PANEL
ALT: 31 U/L (ref 0–44)
AST: 38 U/L (ref 15–41)
Albumin: 3.9 g/dL (ref 3.5–5.0)
Alkaline Phosphatase: 60 U/L (ref 38–126)
Anion gap: 9 (ref 5–15)
BUN: 10 mg/dL (ref 6–20)
CO2: 26 mmol/L (ref 22–32)
Calcium: 8.1 mg/dL — ABNORMAL LOW (ref 8.9–10.3)
Chloride: 104 mmol/L (ref 98–111)
Creatinine, Ser: 0.88 mg/dL (ref 0.61–1.24)
GFR calc Af Amer: >60 mL/min (ref >60)
GFR calc non Af Amer: >60 mL/min (ref >60)
Glucose, Bld: 105 mg/dL — ABNORMAL HIGH (ref 70–99)
Potassium: 3.8 mmol/L (ref 3.5–5.1)
Sodium: 139 mmol/L (ref 135–145)
Total Bilirubin: 0.8 mg/dL (ref 0.3–1.2)
Total Protein: 7.8 g/dL (ref 6.5–8.1)

## 2020-05-02 LAB — LACTIC ACID, PLASMA: Lactic Acid, Venous: 1.3 mmol/L (ref 0.5–1.9)

## 2020-05-02 MED ORDER — FAMOTIDINE 20 MG PO TABS
20.0000 mg | ORAL_TABLET | Freq: Two times a day (BID) | ORAL | Status: DC
  Administered 2020-05-03 – 2020-05-06 (×8): 20 mg via ORAL
  Filled 2020-05-02 (×8): qty 1

## 2020-05-02 MED ORDER — SODIUM CHLORIDE 0.9 % IV SOLN: INTRAVENOUS | Status: DC

## 2020-05-02 MED ORDER — SODIUM CHLORIDE 0.9 % IV SOLN
100.0000 mg | Freq: Every day | INTRAVENOUS | Status: DC
  Administered 2020-05-04 – 2020-05-06 (×3): 100 mg via INTRAVENOUS
  Filled 2020-05-02 (×3): qty 20
  Filled 2020-05-02: qty 100

## 2020-05-02 MED ORDER — ASCORBIC ACID 500 MG PO TABS
500.0000 mg | ORAL_TABLET | Freq: Every day | ORAL | Status: DC
  Administered 2020-05-03 – 2020-05-06 (×4): 500 mg via ORAL
  Filled 2020-05-02 (×4): qty 1

## 2020-05-02 MED ORDER — ACETAMINOPHEN 500 MG PO TABS
1000.0000 mg | ORAL_TABLET | Freq: Once | ORAL | Status: AC
  Administered 2020-05-02: 1000 mg via ORAL
  Filled 2020-05-02: qty 2

## 2020-05-02 MED ORDER — KETOROLAC TROMETHAMINE 30 MG/ML IJ SOLN
30.0000 mg | Freq: Once | INTRAMUSCULAR | Status: AC
  Administered 2020-05-02: 30 mg via INTRAMUSCULAR
  Filled 2020-05-02: qty 1

## 2020-05-02 MED ORDER — ONDANSETRON HCL 4 MG PO TABS: 4.0000 mg | ORAL_TABLET | Freq: Four times a day (QID) | ORAL | Status: DC | PRN

## 2020-05-02 MED ORDER — VITAMIN D 25 MCG (1000 UNIT) PO TABS
1000.0000 [IU] | ORAL_TABLET | Freq: Every day | ORAL | Status: DC
  Administered 2020-05-03 – 2020-05-06 (×4): 1000 [IU] via ORAL
  Filled 2020-05-02 (×4): qty 1

## 2020-05-02 MED ORDER — DEXAMETHASONE SODIUM PHOSPHATE 10 MG/ML IJ SOLN
6.0000 mg | INTRAMUSCULAR | Status: DC
  Administered 2020-05-03: 6 mg via INTRAVENOUS
  Filled 2020-05-02 (×2): qty 1

## 2020-05-02 MED ORDER — DEXAMETHASONE SODIUM PHOSPHATE 10 MG/ML IJ SOLN
6.0000 mg | Freq: Once | INTRAMUSCULAR | Status: AC
  Administered 2020-05-02: 6 mg via INTRAVENOUS
  Filled 2020-05-02: qty 1

## 2020-05-02 MED ORDER — ACETAMINOPHEN 325 MG PO TABS
ORAL_TABLET | ORAL | Status: AC
  Administered 2020-05-02: 650 mg via ORAL
  Filled 2020-05-02: qty 2

## 2020-05-02 MED ORDER — DEXAMETHASONE 4 MG PO TABS
6.0000 mg | ORAL_TABLET | Freq: Once | ORAL | Status: DC
  Filled 2020-05-02: qty 1.5

## 2020-05-02 MED ORDER — ENOXAPARIN SODIUM 40 MG/0.4ML ~~LOC~~ SOLN
40.0000 mg | SUBCUTANEOUS | Status: DC
  Administered 2020-05-03 – 2020-05-06 (×4): 40 mg via SUBCUTANEOUS
  Filled 2020-05-02 (×4): qty 0.4

## 2020-05-02 MED ORDER — ONDANSETRON HCL 4 MG/2ML IJ SOLN: 4.0000 mg | Freq: Four times a day (QID) | INTRAMUSCULAR | Status: DC | PRN

## 2020-05-02 MED ORDER — ZINC SULFATE 220 (50 ZN) MG PO CAPS
220.0000 mg | ORAL_CAPSULE | Freq: Every day | ORAL | Status: DC
  Administered 2020-05-03 – 2020-05-06 (×4): 220 mg via ORAL
  Filled 2020-05-02 (×4): qty 1

## 2020-05-02 MED ORDER — ASPIRIN EC 81 MG PO TBEC
81.0000 mg | DELAYED_RELEASE_TABLET | Freq: Every day | ORAL | Status: DC
  Administered 2020-05-03 – 2020-05-06 (×4): 81 mg via ORAL
  Filled 2020-05-02 (×4): qty 1

## 2020-05-02 MED ORDER — VITAMIN D (ERGOCALCIFEROL) 1.25 MG (50000 UNIT) PO CAPS: 50000.0000 [IU] | ORAL_CAPSULE | ORAL | Status: DC

## 2020-05-02 MED ORDER — ACETAMINOPHEN 325 MG PO TABS: 650.0000 mg | ORAL_TABLET | Freq: Four times a day (QID) | ORAL | Status: DC | PRN

## 2020-05-02 MED ORDER — GUAIFENESIN ER 600 MG PO TB12
600.0000 mg | ORAL_TABLET | Freq: Two times a day (BID) | ORAL | Status: DC
  Administered 2020-05-03 – 2020-05-06 (×8): 600 mg via ORAL
  Filled 2020-05-02 (×9): qty 1

## 2020-05-02 MED ORDER — INSULIN ASPART 100 UNIT/ML ~~LOC~~ SOLN
0.0000 [IU] | Freq: Three times a day (TID) | SUBCUTANEOUS | Status: DC
  Administered 2020-05-03: 8 [IU] via SUBCUTANEOUS
  Administered 2020-05-03 – 2020-05-04 (×3): 2 [IU] via SUBCUTANEOUS
  Administered 2020-05-04 – 2020-05-05 (×2): 3 [IU] via SUBCUTANEOUS
  Administered 2020-05-05: 16:00:00 5 [IU] via SUBCUTANEOUS
  Administered 2020-05-05 – 2020-05-06 (×2): 2 [IU] via SUBCUTANEOUS
  Filled 2020-05-02 (×8): qty 1

## 2020-05-02 MED ORDER — LINAGLIPTIN 5 MG PO TABS
5.0000 mg | ORAL_TABLET | Freq: Every day | ORAL | Status: DC
  Administered 2020-05-03 – 2020-05-06 (×4): 5 mg via ORAL
  Filled 2020-05-02 (×5): qty 1

## 2020-05-02 MED ORDER — HYDROCOD POLST-CPM POLST ER 10-8 MG/5ML PO SUER: 5.0000 mL | Freq: Two times a day (BID) | ORAL | Status: DC | PRN

## 2020-05-02 MED ORDER — MAGNESIUM HYDROXIDE 400 MG/5ML PO SUSP
30.0000 mL | Freq: Every day | ORAL | Status: DC | PRN
  Filled 2020-05-02: qty 30

## 2020-05-02 MED ORDER — GUAIFENESIN-DM 100-10 MG/5ML PO SYRP
10.0000 mL | ORAL_SOLUTION | ORAL | Status: DC | PRN
  Filled 2020-05-02: qty 10

## 2020-05-02 MED ORDER — TRAZODONE HCL 50 MG PO TABS: 25.0000 mg | ORAL_TABLET | Freq: Every evening | ORAL | Status: DC | PRN

## 2020-05-02 MED ORDER — BARICITINIB 2 MG PO TABS
4.0000 mg | ORAL_TABLET | Freq: Every day | ORAL | Status: DC
  Administered 2020-05-03 – 2020-05-06 (×4): 4 mg via ORAL
  Filled 2020-05-02 (×5): qty 2

## 2020-05-02 MED ORDER — ACETAMINOPHEN 325 MG PO TABS: 650.0000 mg | ORAL_TABLET | Freq: Once | ORAL | Status: AC | PRN

## 2020-05-02 MED ORDER — SODIUM CHLORIDE 0.9 % IV SOLN
200.0000 mg | Freq: Once | INTRAVENOUS | Status: AC
  Administered 2020-05-03: 200 mg via INTRAVENOUS
  Filled 2020-05-02: qty 200

## 2020-05-02 NOTE — H&P (Addendum)
[place]   PATIENT NAME: Jared Werner    MR#:  [id number]  DATE OF BIRTH:  August 18, 1986  DATE OF ADMISSION:  05/02/2020  PRIMARY CARE PHYSICIAN: Lance Bosch, NP   REQUESTING/REFERRING PHYSICIAN: Delton Prairie, MD  CHIEF COMPLAINT:   Chief Complaint  Patient presents with  . Shortness of Breath    Covid Positive    HISTORY OF PRESENT ILLNESS:  Jared Werner  is a 34 y.o. male with a known history of hypertension, obesity and prediabetes, who presented to the emergency room with acute onset of generalized weakness for the last few days with associated anosmia.  He had associated dry cough and dyspnea with occasional wheezing.  He admitted to fever and chills and loss of taste with diminished appetite.  Has also had nausea once yesterday and has been having diarrhea over the last few days.  No chest pain or palpitations.  He tested positive for COVID-19 last Friday.  He started having worsening dyspnea today.  His wife tested positive for COVID-19 3 weeks ago.  She has been doing fairly well at home.  No dysuria, oliguria or hematuria or flank pain.  The patient and his wife have not been vaccinated for COVID-19.  Upon presentation to the emergency room, blood pressure was 145/81 with a temperature of 100.7, respiratory rate 28 and pulse oximetry 92% on room air labs revealed unremarkable CMP and CBC and UA was negative.  COVID-19 PCR came back positive on 04/25/2020.  Portable chest x-ray showed extensive multifocal pneumonia..  EKG showed sinus tachycardia with a rate of 104.  The patient was given 1 g of p.o. Tylenol and 6 mg of p.o. Decadron as well as 30 mg of IM Toradol.  He will be admitted to a medically monitored isolation bed for further evaluation and management. PAST MEDICAL HISTORY:   Past Medical History:  Diagnosis Date  . Back pain   . Hypertension   . Lactose intolerance   . Prediabetes     PAST SURGICAL HISTORY:  History reviewed. No pertinent  surgical history.  He denies any previous surgeries SOCIAL HISTORY:   Social History   Tobacco Use  . Smoking status: Never Smoker  . Smokeless tobacco: Never Used  Substance Use Topics  . Alcohol use: Not Currently    FAMILY HISTORY:   Family History  Problem Relation Age of Onset  . Hyperlipidemia Father     DRUG ALLERGIES:  No Known Allergies  REVIEW OF SYSTEMS:   ROS As per history of present illness. All pertinent systems were reviewed above. Constitutional, HEENT, cardiovascular, respiratory, GI, GU, musculoskeletal, neuro, psychiatric, endocrine, integumentary and hematologic systems were reviewed and are otherwise negative/unremarkable except for positive findings mentioned above in the HPI.  MEDICATIONS AT HOME:   Prior to Admission medications   Medication Sig Start Date End Date Taking? Authorizing Provider  metFORMIN (GLUMETZA) 1000 MG (MOD) 24 hr tablet Take 1,000 mg by mouth daily with breakfast.    [provider]  Vitamin D, Ergocalciferol, (DRISDOL) 1.25 MG (50000 UNIT) CAPS capsule Take 1 capsule (50,000 Units total) by mouth every 7 (seven) days. 11/22/19   Filbert Schilder, MD      VITAL SIGNS:  Blood pressure 123/84, pulse 96, temperature 99.1 F (37.3 C), temperature source Oral, resp. rate (!) 24, height 5\' 10"  (1.778 m), weight 125.2 kg, SpO2 96 %.  PHYSICAL EXAMINATION:  Physical Exam  GENERAL:  34 y.o.-year-old Hispanic American male patient lying in  the bed with mild conversational dyspnea.   EYES: Pupils equal, round, reactive to light and accommodation. No scleral icterus. Extraocular muscles intact.  HEENT: Head atraumatic, normocephalic. Oropharynx and nasopharynx clear.  NECK:  Supple, no jugular venous distention. No thyroid enlargement, no tenderness.  LUNGS:Diminished bibasal breath sounds with bibasal crackles. CARDIOVASCULAR: Regular rate and rhythm, S1, S2 normal. No murmurs, rubs, or gallops.  ABDOMEN: Soft,  nondistended, nontender. Bowel sounds present. No organomegaly or mass.  EXTREMITIES: No pedal edema, cyanosis, or clubbing.  NEUROLOGIC: Cranial nerves II through XII are intact. Muscle strength 5/5 in all extremities. Sensation intact. Gait not checked.  PSYCHIATRIC: The patient is alert and oriented x 3.  Normal affect and good eye contact. SKIN: No obvious rash, lesion, or ulcer.   LABORATORY PANEL:   CBC Recent Labs  Lab 05/02/20 1518  WBC 5.0  HGB 13.5  HCT 38.9*  PLT 175   ------------------------------------------------------------------------------------------------------------------  Chemistries  Recent Labs  Lab 05/02/20 1518  NA 139  K 3.8  CL 104  CO2 26  GLUCOSE 105*  BUN 10  CREATININE 0.88  CALCIUM 8.1*  AST 38  ALT 31  ALKPHOS 60  BILITOT 0.8   ------------------------------------------------------------------------------------------------------------------  Cardiac Enzymes No results for input(s): TROPONINI in the last 168 hours. ------------------------------------------------------------------------------------------------------------------  RADIOLOGY:  DG Chest 2 View  Result Date: 05/02/2020 CLINICAL DATA:  The patient tested positive for COVID-19 1 week ago. Fever, chills, diarrhea and shortness of breath. EXAM: CHEST - 2 VIEW COMPARISON:  None. FINDINGS: Extensive bilateral airspace disease is consistent with pneumonia. Heart size is normal. No pneumothorax or pleural effusion. IMPRESSION: Extensive multifocal pneumonia. Electronically Signed   By: Drusilla Kanner M.D.   On: 05/02/2020 15:53      IMPRESSION AND PLAN:  1.  Acute hypoxemic respiratory failure secondary to COVID-19. -The patient will be admitted to a medically monitored isolation bed. -O2 protocol will be followed to keep O2 saturation above 93.   2.  Multifocal pneumonia secondary to COVID-19. -The patient will be admitted to an isolation monitored bed with droplet and  contact precautions. -Given multifocal pneumonia we will empirically place the patient on IV Rocephin and Zithromax for possible bacterial superinfection only with elevated Procalcitonin. -The patient will be placed on scheduled Mucinex and as needed Tussionex. -We will avoid nebulization as much as we can, give bronchodilator MDI if needed, and with deterioration of oxygenation try to avoid BiPAP/CPAP if possible.    -Will obtain sputum Gram stain culture and sensitivity and follow blood cultures. -O2 protocol will be followed. -We will follow CRP, ferritin, LDH and D-dimer. -Will follow manual differential for ANC/ALC ratio as well as follow troponin I and daily CBC with manual differential and CMP. - Will place the patient on IV Remdesivir and IV steroid therapy with Decadron with elevated inflammatory markers. -I discussed Baricitinib  with the patient and he agreed to proceed with it. -The patient will be placed on vitamin D3, vitamin C, zinc sulfate, p.o. Pepcid and aspirin.  3.  Prediabetes. -We will hold off his Metformin and place him on supplement coverage with NovoLog. -We will check hemoglobin A1c..  4.  DVT prophylaxis. -Subcutaneous Lovenox    All the records are reviewed and case discussed with ED provider. The plan of care was discussed in details with the patient (and family). I answered all questions. The patient agreed to proceed with the above mentioned plan. Further management will depend upon hospital course.   CODE STATUS: Full code  Status is: Inpatient  Remains inpatient appropriate because:Ongoing diagnostic testing needed not appropriate for outpatient work up, Unsafe d/c plan, IV treatments appropriate due to intensity of illness or inability to take PO and Inpatient level of care appropriate due to severity of illness   Dispo: The patient is from: Home              Anticipated d/c is to: Home              Anticipated d/c date is: 3 days               Patient currently is not medically stable to d/c.   TOTAL TIME TAKING CARE OF THIS PATIENT: 55 minutes.    Hannah Beat M.D on 05/02/2020 at 10:13 PM  [facility]   From 7 PM-7 AM, contact night-coverage [email or web address]  CC: Primary care physician; Lance Bosch, NP   Note: This dictation was prepared with Dragon dictation along with smaller phrase technology. Any transcriptional typo errors that result from this process are unintentional.

## 2020-05-02 NOTE — ED Provider Notes (Signed)
[facility] Emergency Department Provider Note ____________________________________________   First MD Initiated Contact with Patient 05/02/20 2020     (approximate)  I have reviewed the triage vital signs and the nursing notes.  HISTORY  Chief Complaint Shortness of Breath (Covid Positive)   HPI Jared Werner is a 34 y.o. malewho presents to the ED for evaluation of shortness of breath.   Chart review indicates obesity, prediabetes.  Patient reports not being vaccinated for COVID-19.  Lives at home with his wife, who was sick with Covid 3 weeks ago and is now improving. Patient reports noticing anosmia 1 week ago today, being tested positive for COVID-19.    Patient reports developing shortness of breath yesterday that has been progressively worsening with past 48 hours.  Patient reports associated nonproductive cough and left-sided sharp chest pains.  Patient reports the chest pain is only present when he is coughing, not with exertion or ambulation.  Chest pain is 3/10 intensity, sharp and to his left-sided chest.  Nonradiating.  Reports associated fevers subjectively that have not been measured at home, transient resolved with home Tylenol PM and ibuprofen.  Reports associated watery diarrhea.  Patient denies syncope, productive cough, abdominal pain, dysuria, vomiting.  Past Medical History:  Diagnosis Date  . Back pain   . Hypertension   . Lactose intolerance   . Prediabetes     Patient Active Problem List   Diagnosis Date Noted  . Pneumonia due to 2019 novel coronavirus 05/02/2020    History reviewed. No pertinent surgical history.  Prior to Admission medications   Medication Sig Start Date End Date Taking? Authorizing Provider  metFORMIN (GLUMETZA) 1000 MG (MOD) 24 hr tablet Take 1,000 mg by mouth daily with breakfast.    [provider]  Vitamin D, Ergocalciferol, (DRISDOL) 1.25 MG (50000 UNIT) CAPS capsule Take 1 capsule  (50,000 Units total) by mouth every 7 (seven) days. 11/22/19   Filbert Schilder, MD    Allergies Patient has no known allergies.  Family History  Problem Relation Age of Onset  . Hyperlipidemia Father     Social History Social History   Tobacco Use  . Smoking status: Never Smoker  . Smokeless tobacco: Never Used  Substance Use Topics  . Alcohol use: Not Currently  . Drug use: Never    Review of Systems  Constitutional: No fever/chills Eyes: No visual changes. ENT: No sore throat. Cardiovascular: Positive for chest pain. Respiratory: Positive for shortness of breath Gastrointestinal: No abdominal pain.  No nausea, no vomiting.  No diarrhea.  No constipation. Genitourinary: Negative for dysuria. Musculoskeletal: Negative for back pain. Skin: Negative for rash. Neurological: Negative for headaches, focal weakness or numbness.   ____________________________________________   PHYSICAL EXAM:  VITAL SIGNS: Vitals:   05/02/20 2222 05/02/20 2230  BP: (!) 143/88 (!) 142/83  Pulse: (!) 101 98  Resp:    Temp: (!) 100.5 F (38.1 C)   SpO2: (!) 88% 91%      Constitutional: Alert and oriented. Well appearing and in no acute distress.  Obese.  Sitting up in bed and conversational in full sentences.  Warm to the touch. Eyes: Conjunctivae are normal. PERRL. EOMI. Head: Atraumatic. Nose: No congestion/rhinnorhea. Mouth/Throat: Mucous membranes are dry.  Oropharynx non-erythematous. Neck: No stridor. No cervical spine tenderness to palpation. Cardiovascular: Tachycardic rate, regular rhythm. Grossly normal heart sounds.  Good peripheral circulation. Respiratory: Mild tachypnea to the mid 20s, otherwise normal respiratory effort.  No retractions. Lungs CTAB. Gastrointestinal: Soft ,  nondistended, nontender to palpation. No abdominal bruits. No CVA tenderness. Musculoskeletal: No lower extremity tenderness nor edema.  No joint effusions. No signs of acute  trauma. Neurologic:  Normal speech and language. No gross focal neurologic deficits are appreciated. No gait instability noted. Skin:  Skin is warm, dry and intact. No rash noted. Psychiatric: Mood and affect are normal. Speech and behavior are normal.  ____________________________________________   LABS (all labs ordered are listed, but only abnormal results are displayed)  Labs Reviewed  COMPREHENSIVE METABOLIC PANEL - Abnormal; Notable for the following components:      Result Value   Glucose, Bld 105 (*)    Calcium 8.1 (*)    All other components within normal limits  CBC WITH DIFFERENTIAL/PLATELET - Abnormal; Notable for the following components:   HCT 38.9 (*)    All other components within normal limits  URINALYSIS, COMPLETE (UACMP) WITH MICROSCOPIC - Abnormal; Notable for the following components:   Color, Urine YELLOW (*)    APPearance HAZY (*)    Protein, ur 100 (*)    Bacteria, UA RARE (*)    All other components within normal limits  LACTIC ACID, PLASMA  LACTIC ACID, PLASMA  HIV ANTIBODY (ROUTINE TESTING W REFLEX)  CREATININE, SERUM  CBC WITH DIFFERENTIAL/PLATELET  BRAIN NATRIURETIC PEPTIDE  C-REACTIVE PROTEIN  FIBRIN DERIVATIVES D-DIMER ([facility] ONLY)  FERRITIN  LACTATE DEHYDROGENASE  FIBRINOGEN  PROCALCITONIN  COMPREHENSIVE METABOLIC PANEL  C-REACTIVE PROTEIN  FIBRIN DERIVATIVES D-DIMER ([facility] ONLY)  FERRITIN  HEMOGLOBIN A1C  ABO/RH  TROPONIN I (HIGH SENSITIVITY)   ____________________________________________  12 Lead EKG Sinus rhythm, 104 bpm, normal axis and intervals.  No evidence of acute ischemia.  ____________________________________________  RADIOLOGY  ED MD interpretation: 2 view CXR with diffuse multifocal infiltrates consistent with COVID-19 without evidence of discrete lobar infiltrates and to suggest a bacterial pneumonia  Official radiology report(s): DG Chest 2 View  Result Date: 05/02/2020 CLINICAL DATA:  The patient tested positive  for COVID-19 1 week ago. Fever, chills, diarrhea and shortness of breath. EXAM: CHEST - 2 VIEW COMPARISON:  None. FINDINGS: Extensive bilateral airspace disease is consistent with pneumonia. Heart size is normal. No pneumothorax or pleural effusion. IMPRESSION: Extensive multifocal pneumonia. Electronically Signed   By: Drusilla Kanner M.D.   On: 05/02/2020 15:53    ____________________________________________   PROCEDURES and INTERVENTIONS  Procedure(s) performed (including Critical Care):  Procedures  Medications  Vitamin D (Ergocalciferol) (DRISDOL) capsule 50,000 Units ( Oral Canceled Entry 05/02/20 2226)  enoxaparin (LOVENOX) injection 40 mg (has no administration in time range)  0.9 %  sodium chloride infusion (has no administration in time range)  dexamethasone (DECADRON) injection 6 mg ( Intravenous Canceled Entry 05/02/20 2227)  guaiFENesin-dextromethorphan (ROBITUSSIN DM) 100-10 MG/5ML syrup 10 mL (has no administration in time range)  chlorpheniramine-HYDROcodone (TUSSIONEX) 10-8 MG/5ML suspension 5 mL (has no administration in time range)  linagliptin (TRADJENTA) tablet 5 mg (has no administration in time range)  insulin aspart (novoLOG) injection 0-15 Units (has no administration in time range)  ascorbic acid (VITAMIN C) tablet 500 mg (has no administration in time range)  zinc sulfate capsule 220 mg (has no administration in time range)  famotidine (PEPCID) tablet 20 mg (has no administration in time range)  acetaminophen (TYLENOL) tablet 650 mg (has no administration in time range)  traZODone (DESYREL) tablet 25 mg (has no administration in time range)  magnesium hydroxide (MILK OF MAGNESIA) suspension 30 mL (has no administration in time range)  ondansetron (ZOFRAN) tablet 4 mg (  has no administration in time range)    Or  ondansetron (ZOFRAN) injection 4 mg (has no administration in time range)  aspirin EC tablet 81 mg (has no administration in time range)  remdesivir  200 mg in sodium chloride 0.9% 250 mL IVPB (has no administration in time range)    Followed by  remdesivir 100 mg in sodium chloride 0.9 % 100 mL IVPB (has no administration in time range)  acetaminophen (TYLENOL) tablet 650 mg (650 mg Oral Given 05/02/20 1534)  acetaminophen (TYLENOL) tablet 1,000 mg (1,000 mg Oral Given 05/02/20 2139)  ketorolac (TORADOL) 30 MG/ML injection 30 mg (30 mg Intramuscular Given 05/02/20 2139)  dexamethasone (DECADRON) injection 6 mg (6 mg Intravenous Given 05/02/20 2220)    ____________________________________________   MDM / ED COURSE  Obese 34 year old male recently diagnosed with COVID-19, presenting with worsening shortness of breath and found to be hypoxic requiring medical admission.  Patient with low-grade fever, tachycardic and hypoxic to the mid 80s on room air requiring 2 L nasal cannula.  Exam with an obese and uncomfortable-appearing patient who is dyspneic and tachypneic, but otherwise not evidence of acute pathology.  He has clear lung sounds and moving good air on auscultation.  CXR demonstrates expected infiltrates in the setting of COVID-19, though of significant severity.  Patient hypoxic on room air requiring nasal cannula, and so initiated course of Decadron to treat his severe COVID-19 infection.  Will admit the patient to hospitalist medicine for further work-up and management.  ____________________________________________   FINAL CLINICAL IMPRESSION(S) / ED DIAGNOSES  Final diagnoses:  Shortness of breath  COVID-19  Fever in other diseases  Hypoxia     ED Discharge Orders    None       Ahsley Attwood   Note:  This document was prepared using Dragon voice recognition software and may include unintentional dictation errors.   Delton Prairie, MD 05/02/20 [phone]

## 2020-05-02 NOTE — ED Notes (Addendum)
Pt ambulated on RA with pulse ox. Pt's O2 sat remained at 87%, pt sat on side of bed O2 remained in mid 80s.  Pt placed on 2L o2 per EDP. O2 sat increased to 92%.

## 2020-05-02 NOTE — ED Triage Notes (Signed)
Patient presents to the ED for shortness of breath that is much worse today.  Patient states he tested positive for Covid19 last Friday and was feeling better until today.  Patient is tachypnic in triage after walking from waiting room to triage room.  Patient is able to speak in full sentences at this time.

## 2020-05-02 NOTE — ED Notes (Signed)
Pt's O2 sat at 88% on L O2. This RN increased O2 to 4L O2.  Will inform admitting MD.

## 2020-05-03 ENCOUNTER — Encounter: Payer: Self-pay | Admitting: Family Medicine

## 2020-05-03 DIAGNOSIS — U071 COVID-19: Secondary | ICD-10-CM

## 2020-05-03 DIAGNOSIS — R7303 Prediabetes: Secondary | ICD-10-CM | POA: Diagnosis present

## 2020-05-03 DIAGNOSIS — J9601 Acute respiratory failure with hypoxia: Secondary | ICD-10-CM

## 2020-05-03 DIAGNOSIS — T380X5A Adverse effect of glucocorticoids and synthetic analogues, initial encounter: Secondary | ICD-10-CM

## 2020-05-03 DIAGNOSIS — R739 Hyperglycemia, unspecified: Secondary | ICD-10-CM

## 2020-05-03 DIAGNOSIS — E669 Obesity, unspecified: Secondary | ICD-10-CM

## 2020-05-03 HISTORY — DX: Acute respiratory failure with hypoxia: J96.01

## 2020-05-03 HISTORY — DX: COVID-19: U07.1

## 2020-05-03 LAB — COMPREHENSIVE METABOLIC PANEL
ALT: 30 U/L (ref 0–44)
AST: 33 U/L (ref 15–41)
Albumin: 3.5 g/dL (ref 3.5–5.0)
Alkaline Phosphatase: 56 U/L (ref 38–126)
Anion gap: 11 (ref 5–15)
BUN: 12 mg/dL (ref 6–20)
CO2: 24 mmol/L (ref 22–32)
Calcium: 8.1 mg/dL — ABNORMAL LOW (ref 8.9–10.3)
Chloride: 102 mmol/L (ref 98–111)
Creatinine, Ser: 0.68 mg/dL (ref 0.61–1.24)
GFR calc Af Amer: >60 mL/min (ref >60)
GFR calc non Af Amer: >60 mL/min (ref >60)
Glucose, Bld: 220 mg/dL — ABNORMAL HIGH (ref 70–99)
Potassium: 3.8 mmol/L (ref 3.5–5.1)
Sodium: 137 mmol/L (ref 135–145)
Total Bilirubin: 0.8 mg/dL (ref 0.3–1.2)
Total Protein: 7.4 g/dL (ref 6.5–8.1)

## 2020-05-03 LAB — CBC WITH DIFFERENTIAL/PLATELET
Abs Immature Granulocytes: 0.04 10*3/uL (ref 0.00–0.07)
Basophils Absolute: 0 10*3/uL (ref 0.0–0.1)
Basophils Relative: 0 %
Eosinophils Absolute: 0 10*3/uL (ref 0.0–0.5)
Eosinophils Relative: 0 %
HCT: 37.6 % — ABNORMAL LOW (ref 39.0–52.0)
Hemoglobin: 13.1 g/dL (ref 13.0–17.0)
Immature Granulocytes: 1 %
Lymphocytes Relative: 18 %
Lymphs Abs: 0.6 10*3/uL — ABNORMAL LOW (ref 0.7–4.0)
MCH: 29.2 pg (ref 26.0–34.0)
MCHC: 34.8 g/dL (ref 30.0–36.0)
MCV: 83.9 fL (ref 80.0–100.0)
Monocytes Absolute: 0.1 10*3/uL (ref 0.1–1.0)
Monocytes Relative: 3 %
Neutro Abs: 2.6 10*3/uL (ref 1.7–7.7)
Neutrophils Relative %: 78 %
Platelets: 190 10*3/uL (ref 150–400)
RBC: 4.48 MIL/uL (ref 4.22–5.81)
RDW: 12.7 % (ref 11.5–15.5)
WBC: 3.4 10*3/uL — ABNORMAL LOW (ref 4.0–10.5)
nRBC: 0 % (ref 0.0–0.2)

## 2020-05-03 LAB — HIV ANTIBODY (ROUTINE TESTING W REFLEX): HIV Screen 4th Generation wRfx: NONREACTIVE

## 2020-05-03 LAB — GLUCOSE, CAPILLARY
Glucose-Capillary: 125 mg/dL — ABNORMAL HIGH (ref 70–99)
Glucose-Capillary: 158 mg/dL — ABNORMAL HIGH (ref 70–99)

## 2020-05-03 LAB — TROPONIN I (HIGH SENSITIVITY): Troponin I (High Sensitivity): 4 ng/L (ref <18)

## 2020-05-03 LAB — FERRITIN: Ferritin: 713 ng/mL — ABNORMAL HIGH (ref 24–336)

## 2020-05-03 LAB — BRAIN NATRIURETIC PEPTIDE: B Natriuretic Peptide: 38.6 pg/mL (ref 0.0–100.0)

## 2020-05-03 LAB — LACTATE DEHYDROGENASE: LDH: 404 U/L — ABNORMAL HIGH (ref 98–192)

## 2020-05-03 LAB — ABO/RH: ABO/RH(D): AB POS

## 2020-05-03 LAB — C-REACTIVE PROTEIN: CRP: 12.8 mg/dL — ABNORMAL HIGH (ref <1.0)

## 2020-05-03 LAB — HEMOGLOBIN A1C
Hgb A1c MFr Bld: 5.9 % — ABNORMAL HIGH (ref 4.8–5.6)
Mean Plasma Glucose: 122.63 mg/dL

## 2020-05-03 LAB — PROCALCITONIN: Procalcitonin: <0.1 ng/mL

## 2020-05-03 NOTE — Progress Notes (Signed)
PROGRESS NOTE  Alandis Bluemel VVO:[id number] DOB: [birth year] DOA: 05/02/2020 PCP: Lance Bosch, NP  Brief History   33yow PMH obesity, prediabetes presented with generalized weakness, anosmia, cough, fever, loss of taste. Admitted for acute hypoxic respiratory failure secondary to COVID-19 multifocal pneumonia.  A & P  Acute hypoxemic respiratory failure due to COVID-19 (HCC) Appears stable for admission to medical bed.  Continue present therapy. --CXR showed extensive multifocal pneumonia --oxygen 4L [place] --inflammator y markers . Ferritin 713 >  . CRP 12.8  --Tx . Remdesiver 8/20 >  . Steroids 8/20 >  . baricitinim 8/20 >  Pneumonia due to 2019 novel coronavirus --Plan as above  Prediabetes --Hemoglobin A1c 5.9 --Complicated by steroid-induced hyperglycemia --Sliding scale insulin.  May need long-acting insulin while on steroids.  Steroid-induced hyperglycemia --See prediabetes plan  Obesity (BMI 35.0-39.9 without comorbidity) --Dietitian consult  Disposition Plan:  Discussion: Continue present management, requiring 4 L nasal cannula.  Status is: Inpatient  Remains inpatient appropriate because:IV treatments appropriate due to intensity of illness or inability to take PO   Dispo: The patient is from: Home              Anticipated d/c is to: Home              Anticipated d/c date is: 3 days              Patient currently is not medically stable to d/c.  DVT prophylaxis: enoxaparin (LOVENOX) injection 40 mg Start: 05/03/20 1000 Code Status: Full Family Communication: none  Brendia Sacks, MD  [facility] Direct contact: see [email or web address] (further directions at bottom of note if needed) 7PM-7AM contact night coverage as at bottom of note 05/03/2020, 11:37 AM  LOS: 1 day   Significant Hospital Events   . 8/20 admit for COVID hypoxia   Consults:  .    Procedures:  .   Significant Diagnostic Tests:  Marland Kitchen    Micro Data:  .    Antimicrobials:   .   Interval History/Subjective  Feels better but still dyspneic w/ exertion Eating ok No pain  Objective   Vitals:  Vitals:   05/03/20 0426 05/03/20 1010  BP: 118/77 121/77  Pulse: 94 96  Resp: (!) 27 (!) 22  Temp: 98.8 F (37.1 C)   SpO2: 92% 93%    Exam:  Constitutional:   . Appears calm and comfortable, nontoxic Respiratory:  . CTA bilaterally, no w/r/r.  . Respiratory effort mildly increased . On 4L [place] Cardiovascular:  . RRR, no m/r/g . No LE extremity edema   Psychiatric:  . Mental status o Mood, affect appropriate  I have personally reviewed the following:   Today's Data  . CMP unremarkable . CBC unremarkable Scheduled Meds: . vitamin C  500 mg Oral Daily  . aspirin EC  81 mg Oral Daily  . baricitinib  4 mg Oral Daily  . cholecalciferol  1,000 Units Oral Daily  . dexamethasone (DECADRON) injection  6 mg Intravenous Q24H  . enoxaparin (LOVENOX) injection  40 mg Subcutaneous Q24H  . famotidine  20 mg Oral BID  . guaiFENesin  600 mg Oral BID  . insulin aspart  0-15 Units Subcutaneous TID WC  . linagliptin  5 mg Oral Daily  . [START ON 05/08/2020] Vitamin D (Ergocalciferol)  50,000 Units Oral Q7 days  . zinc sulfate  220 mg Oral Daily   Continuous Infusions: . sodium chloride 100 mL/hr at 05/03/20 0417  . [START ON 05/04/2020] remdesivir 100  mg in NS 100 mL      Principal Problem:   Acute hypoxemic respiratory failure due to COVID-19 [facility]) Active Problems:   Pneumonia due to 2019 novel coronavirus   Prediabetes   Steroid-induced hyperglycemia   Obesity (BMI 35.0-39.9 without comorbidity)   LOS: 1 day   How to contact the [facility] Attending or Consulting provider 7A - 7P or covering provider during after hours 7P -7A, for this patient?  1. Check the care team in [facility] and look for a) attending/consulting [facility] provider listed and b) the [facility] team listed 2. Log into [email or web address] and use [place]'s universal password to access. If you do not have the  password, please contact the hospital operator. 3. Locate the [facility] provider you are looking for under [facility] and page to a number that you can be directly reached. 4. If you still have difficulty reaching the provider, please page the [facility] (Director on Call) for the Hospitalists listed on amion for assistance.

## 2020-05-03 NOTE — Assessment & Plan Note (Addendum)
--  Dietitian consult appreciated

## 2020-05-03 NOTE — Assessment & Plan Note (Signed)
Plan as above.  

## 2020-05-03 NOTE — ED Notes (Signed)
Pt unhooked and walked to restroom with 4L [place].

## 2020-05-03 NOTE — Assessment & Plan Note (Addendum)
--  Hemoglobin A1c 5.9 --Complicated by steroid-induced hyperglycemia --CBG remained stable, resume metformin on discharge.

## 2020-05-03 NOTE — ED Notes (Signed)
Pt continues resting in NAD.

## 2020-05-03 NOTE — Assessment & Plan Note (Signed)
--  See prediabetes plan

## 2020-05-03 NOTE — Hospital Course (Addendum)
33yow PMH obesity, prediabetes presented with generalized weakness, anosmia, cough, fever, loss of taste. Admitted for acute hypoxic respiratory failure secondary to COVID-19 multifocal pneumonia.

## 2020-05-03 NOTE — Progress Notes (Signed)
Remdesivir - Pharmacy Brief Note     A/P:  Remdesivir 200 mg IVPB once followed by 100 mg IVPB daily x 4 days.   Valrie Hart, PharmD Clinical Pharmacist   05/03/2020 12:50 AM

## 2020-05-03 NOTE — Assessment & Plan Note (Addendum)
--  much better today; hypoxia nearly resolved --admit CXR showed extensive multifocal pneumonia --oxygen RA 90% --inflammatory markers . Ferritin 713 > 866 > 757 > 565 . CRP 12.8 > 4.1 > 1.6 > 0.8  . Fibrin derivatives 1152 > 658 > 784 --Tx . Remdesiver 8/21 > 8/25. Arranged to get last dose of remdesivir at [facility] outpatient infusion center 8/25 . Steroids 8/20 > taper . baricitinim 8/20 > stopped on discharge

## 2020-05-03 NOTE — ED Notes (Signed)
Introduced self to pt. Pt denies further needs at this time.

## 2020-05-04 ENCOUNTER — Encounter: Payer: Self-pay | Admitting: Family Medicine

## 2020-05-04 LAB — COMPREHENSIVE METABOLIC PANEL
ALT: 32 U/L (ref 0–44)
AST: 33 U/L (ref 15–41)
Albumin: 3.5 g/dL (ref 3.5–5.0)
Alkaline Phosphatase: 53 U/L (ref 38–126)
Anion gap: 13 (ref 5–15)
BUN: 15 mg/dL (ref 6–20)
CO2: 20 mmol/L — ABNORMAL LOW (ref 22–32)
Calcium: 8.7 mg/dL — ABNORMAL LOW (ref 8.9–10.3)
Chloride: 105 mmol/L (ref 98–111)
Creatinine, Ser: 0.71 mg/dL (ref 0.61–1.24)
GFR calc Af Amer: >60 mL/min (ref >60)
GFR calc non Af Amer: >60 mL/min (ref >60)
Glucose, Bld: 140 mg/dL — ABNORMAL HIGH (ref 70–99)
Potassium: 3.8 mmol/L (ref 3.5–5.1)
Sodium: 138 mmol/L (ref 135–145)
Total Bilirubin: 0.8 mg/dL (ref 0.3–1.2)
Total Protein: 7.2 g/dL (ref 6.5–8.1)

## 2020-05-04 LAB — CBC WITH DIFFERENTIAL/PLATELET
Abs Immature Granulocytes: 0.27 10*3/uL — ABNORMAL HIGH (ref 0.00–0.07)
Basophils Absolute: 0 10*3/uL (ref 0.0–0.1)
Basophils Relative: 0 %
Eosinophils Absolute: 0 10*3/uL (ref 0.0–0.5)
Eosinophils Relative: 0 %
HCT: 37.6 % — ABNORMAL LOW (ref 39.0–52.0)
Hemoglobin: 13.2 g/dL (ref 13.0–17.0)
Immature Granulocytes: 4 %
Lymphocytes Relative: 20 %
Lymphs Abs: 1.3 10*3/uL (ref 0.7–4.0)
MCH: 28.8 pg (ref 26.0–34.0)
MCHC: 35.1 g/dL (ref 30.0–36.0)
MCV: 81.9 fL (ref 80.0–100.0)
Monocytes Absolute: 0.4 10*3/uL (ref 0.1–1.0)
Monocytes Relative: 6 %
Neutro Abs: 4.5 10*3/uL (ref 1.7–7.7)
Neutrophils Relative %: 70 %
Platelets: 270 10*3/uL (ref 150–400)
RBC: 4.59 MIL/uL (ref 4.22–5.81)
RDW: 13.1 % (ref 11.5–15.5)
WBC: 6.4 10*3/uL (ref 4.0–10.5)
nRBC: 0 % (ref 0.0–0.2)

## 2020-05-04 LAB — GLUCOSE, CAPILLARY
Glucose-Capillary: 148 mg/dL — ABNORMAL HIGH (ref 70–99)
Glucose-Capillary: 152 mg/dL — ABNORMAL HIGH (ref 70–99)
Glucose-Capillary: 150 mg/dL — ABNORMAL HIGH (ref 70–99)
Glucose-Capillary: 154 mg/dL — ABNORMAL HIGH (ref 70–99)

## 2020-05-04 LAB — FERRITIN: Ferritin: 866 ng/mL — ABNORMAL HIGH (ref 24–336)

## 2020-05-04 LAB — FIBRIN DERIVATIVES D-DIMER (ARMC ONLY): Fibrin derivatives D-dimer (ARMC): 1152.58 ng/mL (FEU) — ABNORMAL HIGH (ref 0.00–499.00)

## 2020-05-04 LAB — C-REACTIVE PROTEIN: CRP: 4.1 mg/dL — ABNORMAL HIGH (ref <1.0)

## 2020-05-04 MED ORDER — ENSURE MAX PROTEIN PO LIQD
11.0000 [oz_av] | Freq: Two times a day (BID) | ORAL | Status: DC
  Administered 2020-05-04 – 2020-05-06 (×4): 11 [oz_av] via ORAL
  Filled 2020-05-04: qty 330

## 2020-05-04 MED ORDER — METHYLPREDNISOLONE SODIUM SUCC 40 MG IJ SOLR
40.0000 mg | Freq: Two times a day (BID) | INTRAMUSCULAR | Status: DC
  Administered 2020-05-04 – 2020-05-06 (×5): 40 mg via INTRAVENOUS
  Filled 2020-05-04 (×5): qty 1

## 2020-05-04 NOTE — Progress Notes (Signed)
PROGRESS NOTE  Jared Werner KHT:[id number] DOB: Mar 09, 1986 DOA: 05/02/2020 PCP: Lance Bosch, NP  Brief History   33yow PMH obesity, prediabetes presented with generalized weakness, anosmia, cough, fever, loss of taste. Admitted for acute hypoxic respiratory failure secondary to COVID-19 multifocal pneumonia.  A & P  Acute hypoxemic respiratory failure due to COVID-19 [facility]) --appears stable today; still mild SOB --CXR showed extensive multifocal pneumonia --oxygen 4.5L [place] --inflammatory markers . Ferritin 713 > 866  . CRP 12.8 > 4.1 . Fibrin derivatives 1152 > --Tx . Remdesiver 8/20 >  . Steroids 8/20 >  . baricitinim 8/20 >  Pneumonia due to 2019 novel coronavirus --Plan as above  Prediabetes --Hemoglobin A1c 5.9 --Complicated by steroid-induced hyperglycemia --CBG stable, continue sliding scale insulin and linagliptan.  Steroid-induced hyperglycemia --See prediabetes plan  Obesity (BMI 35.0-39.9 without comorbidity) --Dietitian consult appreciated  Disposition Plan:  Discussion: Clinically about the same today, oxygen requirement stable, subjectively stable.  Inflammatory markers mixed.  Continue present treatment.  Status is: Inpatient  Remains inpatient appropriate because:IV treatments appropriate due to intensity of illness or inability to take PO   Dispo: The patient is from: Home              Anticipated d/c is to: Home              Anticipated d/c date is: 3 days              Patient currently is not medically stable to d/c.  DVT prophylaxis: enoxaparin (LOVENOX) injection 40 mg Start: 05/03/20 1000 Code Status: Full Family Communication: none  Brendia Sacks, MD  [facility] Direct contact: see [email or web address] (further directions at bottom of note if needed) 7PM-7AM contact night coverage as at bottom of note 05/04/2020, 4:00 PM  LOS: 2 days   Significant Hospital Events   . 8/20 admit for COVID hypoxia   Consults:  .    Procedures:    .   Significant Diagnostic Tests:  Marland Kitchen    Micro Data:  .    Antimicrobials:  .   Interval History/Subjective  Feels about the same; SOB w/ ambulation to the bathroom Eating ok No pain  Objective   Vitals:  Vitals:   05/04/20 0825 05/04/20 1223  BP: 114/69 110/66  Pulse: 91 83  Resp: 18 14  Temp: 98.2 F (36.8 C) 98 F (36.7 C)  SpO2: 93% 93%    Exam: Constitutional:   . Appears calm and comfortable ENMT:  . grossly normal hearing  Respiratory:  . CTA bilaterally, no w/r/r.  . Respiratory effort mildly increased; able to speak in full sentences. Cardiovascular:  . RRR, no m/r/g . No LE extremity edema   . Telemetry SR Psychiatric:  . Mental status o Mood, affect appropriate  I have personally reviewed the following:   Today's Data  . CBG stable . CMP unremarkable . CBC stable  Scheduled Meds: . vitamin C  500 mg Oral Daily  . aspirin EC  81 mg Oral Daily  . baricitinib  4 mg Oral Daily  . cholecalciferol  1,000 Units Oral Daily  . enoxaparin (LOVENOX) injection  40 mg Subcutaneous Q24H  . famotidine  20 mg Oral BID  . guaiFENesin  600 mg Oral BID  . insulin aspart  0-15 Units Subcutaneous TID WC  . linagliptin  5 mg Oral Daily  . methylPREDNISolone (SOLU-MEDROL) injection  40 mg Intravenous Q12H  . Ensure Max Protein  11 oz Oral BID BM  . Melene Muller  ON 05/08/2020] Vitamin D (Ergocalciferol)  50,000 Units Oral Q7 days  . zinc sulfate  220 mg Oral Daily   Continuous Infusions: . remdesivir 100 mg in NS 100 mL 100 mg (05/04/20 0935)    Principal Problem:   Acute hypoxemic respiratory failure due to COVID-19 [facility]) Active Problems:   Pneumonia due to 2019 novel coronavirus   Prediabetes   Steroid-induced hyperglycemia   Obesity (BMI 35.0-39.9 without comorbidity)   LOS: 2 days   How to contact the [facility] Attending or Consulting provider 7A - 7P or covering provider during after hours 7P -7A, for this patient?  1. Check the care team in [facility] and look  for a) attending/consulting TRH provider listed and b) the [facility] team listed 2. Log into [email or web address] and use [place]'s universal password to access. If you do not have the password, please contact the hospital operator. 3. Locate the [facility] provider you are looking for under [facility] and page to a number that you can be directly reached. 4. If you still have difficulty reaching the provider, please page the [facility] (Director on Call) for the Hospitalists listed on amion for assistance.

## 2020-05-04 NOTE — Progress Notes (Signed)
Initial Nutrition Assessment  RD working remotely.  DOCUMENTATION CODES:   Obesity unspecified  INTERVENTION:  Provide Ensure Max Protein po BID, each supplement provides 150 kcal and 30 grams of protein.  NUTRITION DIAGNOSIS:   Increased nutrient needs related to catabolic illness ([id number]) as evidenced by estimated needs.  GOAL:   Patient will meet greater than or equal to 90% of their needs  MONITOR:   PO intake, Supplement acceptance, Labs, Weight trends, I & O's  REASON FOR ASSESSMENT:   Consult Assessment of nutrition requirement/status  ASSESSMENT:   34 year old male with PMHx of prediabetes, HTN admitted with COVID-19.   RD received consult for assessment of nutrition requirements/status. Per review of MD note consult was placed in setting of obesity. Not appropriate to address weight loss as patient is acutely ill with COVID-19, which is a catabolic disease state that increases calorie/protein needs and can put patient at risk for malnutrition if those increased needs are not met. Consider referral for outpatient nutrition counseling once acute illness has resolved if patient desires.  Spoke with patient over the phone. He reports his appetite is improving now and he is eating well at meals. He reports he is eating 100% of his meals. Discussed catabolic nature of RDEYC-14 and increased calorie/protein needs. He is amenable to drinking an oral nutrition supplement between meals to help meet increased needs. Noted in chart patient was lactose-intolerant. He reports he can tolerate small amounts of lactose and has tolerated Premier Protein before. Ensure Max Protein is suitable for patients with lactose intolerance.  Patient denies any unintentional weight loss and reports he is weight-stable. Patient currently 125.2 kg (276 lbs).  Medications reviewed and include: vitamin C 500 mg daily, vitamin D3 1000 units daily, famotidine, Novolog 0-15 units TID, Solu-Medrol 40 mg  Q12hrs IV, zinc sulfate 22 mg daily, NS at 100 mL/hr, remdesivir.  Labs reviewed: CBG 125-158, CO2 20.  NUTRITION - FOCUSED PHYSICAL EXAM:  Unable to complete as RD is working remotely.  Diet Order:   Diet Order            Diet heart healthy/carb modified Room service appropriate? Yes; Fluid consistency: Thin  Diet effective now                EDUCATION NEEDS:   Not appropriate for education at this time  Skin:  Skin Assessment: Reviewed RN Assessment  Last BM:  05/02/2020 - diarrhea per chart  Height:   Ht Readings from Last 1 Encounters:  05/02/20 $RemoveB'5\' 10"'EVhhafgF$  (1.778 m)   Weight:   Wt Readings from Last 1 Encounters:  05/02/20 125.2 kg   BMI:  Body mass index is 39.6 kg/m.  Estimated Nutritional Needs:   Kcal:  2500-2700  Protein:  125-135 grams  Fluid:  >/= 2.3 L/day  Jacklynn Barnacle, MS, RD, LDN Pager number available on Amion

## 2020-05-04 NOTE — ED Notes (Signed)
PT reports improvement in shortness of breath since being on oxygen, pt able to speak in complete sentences without running out of breath, but reports shortness of breath when ambulating even with oxygen.

## 2020-05-04 NOTE — ED Notes (Signed)
Pt resting quietly at this time, lights turned off for patient comfort, denies any needs at this time. Advised patient he has labs ordered for 5am. Verbalized understanding.

## 2020-05-04 NOTE — ED Notes (Signed)
Attempted to place new IV due to current IV being positional, unable to obtain access at this time. Previous shift RN also tried x1 and was unsuccessful

## 2020-05-04 NOTE — ED Notes (Signed)
Attempted to call report, nurse will call back-was in isolation room.

## 2020-05-05 DIAGNOSIS — R7401 Elevation of levels of liver transaminase levels: Secondary | ICD-10-CM

## 2020-05-05 LAB — COMPREHENSIVE METABOLIC PANEL
ALT: 68 U/L — ABNORMAL HIGH (ref 0–44)
AST: 41 U/L (ref 15–41)
Albumin: 3.4 g/dL — ABNORMAL LOW (ref 3.5–5.0)
Alkaline Phosphatase: 54 U/L (ref 38–126)
Anion gap: 11 (ref 5–15)
BUN: 22 mg/dL — ABNORMAL HIGH (ref 6–20)
CO2: 25 mmol/L (ref 22–32)
Calcium: 8.8 mg/dL — ABNORMAL LOW (ref 8.9–10.3)
Chloride: 105 mmol/L (ref 98–111)
Creatinine, Ser: 0.94 mg/dL (ref 0.61–1.24)
GFR calc Af Amer: >60 mL/min (ref >60)
GFR calc non Af Amer: >60 mL/min (ref >60)
Glucose, Bld: 157 mg/dL — ABNORMAL HIGH (ref 70–99)
Potassium: 4.7 mmol/L (ref 3.5–5.1)
Sodium: 141 mmol/L (ref 135–145)
Total Bilirubin: 0.7 mg/dL (ref 0.3–1.2)
Total Protein: 6.9 g/dL (ref 6.5–8.1)

## 2020-05-05 LAB — FIBRIN DERIVATIVES D-DIMER (ARMC ONLY): Fibrin derivatives D-dimer (ARMC): 658.07 ng/mL (FEU) — ABNORMAL HIGH (ref 0.00–499.00)

## 2020-05-05 LAB — CBC WITH DIFFERENTIAL/PLATELET
Abs Immature Granulocytes: 0.71 10*3/uL — ABNORMAL HIGH (ref 0.00–0.07)
Basophils Absolute: 0 10*3/uL (ref 0.0–0.1)
Basophils Relative: 0 %
Eosinophils Absolute: 0 10*3/uL (ref 0.0–0.5)
Eosinophils Relative: 0 %
HCT: 38.2 % — ABNORMAL LOW (ref 39.0–52.0)
Hemoglobin: 13.2 g/dL (ref 13.0–17.0)
Immature Granulocytes: 7 %
Lymphocytes Relative: 18 %
Lymphs Abs: 1.7 10*3/uL (ref 0.7–4.0)
MCH: 29.6 pg (ref 26.0–34.0)
MCHC: 34.6 g/dL (ref 30.0–36.0)
MCV: 85.7 fL (ref 80.0–100.0)
Monocytes Absolute: 0.7 10*3/uL (ref 0.1–1.0)
Monocytes Relative: 7 %
Neutro Abs: 6.6 10*3/uL (ref 1.7–7.7)
Neutrophils Relative %: 68 %
Platelets: 329 10*3/uL (ref 150–400)
RBC: 4.46 MIL/uL (ref 4.22–5.81)
RDW: 13.1 % (ref 11.5–15.5)
Smear Review: NORMAL
WBC: 9.7 10*3/uL (ref 4.0–10.5)
nRBC: 0 % (ref 0.0–0.2)

## 2020-05-05 LAB — GLUCOSE, CAPILLARY
Glucose-Capillary: 143 mg/dL — ABNORMAL HIGH (ref 70–99)
Glucose-Capillary: 135 mg/dL — ABNORMAL HIGH (ref 70–99)
Glucose-Capillary: 151 mg/dL — ABNORMAL HIGH (ref 70–99)
Glucose-Capillary: 217 mg/dL — ABNORMAL HIGH (ref 70–99)
Glucose-Capillary: 116 mg/dL — ABNORMAL HIGH (ref 70–99)

## 2020-05-05 LAB — C-REACTIVE PROTEIN: CRP: 1.6 mg/dL — ABNORMAL HIGH (ref <1.0)

## 2020-05-05 LAB — FERRITIN: Ferritin: 757 ng/mL — ABNORMAL HIGH (ref 24–336)

## 2020-05-05 NOTE — Progress Notes (Signed)
PROGRESS NOTE  Jared Werner CZY:[id number] DOB: 10/13/1985 DOA: 05/02/2020 PCP: Lance Bosch, NP  Brief History   33yow PMH obesity, prediabetes presented with generalized weakness, anosmia, cough, fever, loss of taste. Admitted for acute hypoxic respiratory failure secondary to COVID-19 multifocal pneumonia.  A & P  Acute hypoxemic respiratory failure due to COVID-19 [facility]) --appears much better today --admit CXR showed extensive multifocal pneumonia --oxygen 4.5L [place] --inflammatory markers . Ferritin 713 > 866 > 757 . CRP 12.8 > 4.1 > 1.6 . Fibrin derivatives 1152 > 658 --Tx . Remdesiver 8/20 > 8/24 . Steroids 8/20 >  . baricitinim 8/20 > 14 days or discharge  Pneumonia due to 2019 novel coronavirus --Plan as above  Prediabetes --Hemoglobin A1c 5.9 --Complicated by steroid-induced hyperglycemia --CBG stable, continue sliding scale insulin and linagliptan.  Steroid-induced hyperglycemia --See prediabetes plan  Obesity (BMI 35.0-39.9 without comorbidity) --Dietitian consult appreciated  Elevated transaminase level --mild elevation AST 69 --CMP in AM  Disposition Plan:  Discussion: appears much better today; feeling better; inflammatory markers improving but still on 4.5 L [place]. Will wean to RA if able; last day remdesivir 8/24. May be able to go home 8/24 if continuing to improve  Status is: Inpatient  Remains inpatient appropriate because:IV treatments appropriate due to intensity of illness or inability to take PO   Dispo: The patient is from: Home              Anticipated d/c is to: Home              Anticipated d/c date is: 1 day              Patient currently is not medically stable to d/c.  DVT prophylaxis: enoxaparin (LOVENOX) injection 40 mg Start: 05/03/20 1000 Code Status: Full Family Communication: none  Brendia Sacks, MD  [facility] Direct contact: see [email or web address] (further directions at bottom of note if needed) 7PM-7AM contact night  coverage as at bottom of note 05/05/2020, 1:41 PM  LOS: 3 days   Significant Hospital Events   . 8/20 admit for COVID hypoxia   Consults:  .    Procedures:  .   Significant Diagnostic Tests:  Marland Kitchen    Micro Data:  .    Antimicrobials:  .   Interval History/Subjective  Feels much better today; breathing better; less DOE  Objective   Vitals:  Vitals:   05/05/20 0804 05/05/20 1223  BP: 132/81 134/70  Pulse: 66 70  Resp: 18 18  Temp: 98.4 F (36.9 C) 98.2 F (36.8 C)  SpO2: 95% 93%    Exam: Constitutional:   . Appears calm and comfortable; appears much better ENMT:  . grossly normal hearing  Respiratory:  . CTA bilaterally, no w/r/r.  . Respiratory effort mildly increased Cardiovascular:  . RRR, no m/r/g Psychiatric:  . Mental status o Mood, affect appropriate  I have personally reviewed the following:   Today's Data  . CBG stable . CMP noted . CBC noted  Scheduled Meds: . vitamin C  500 mg Oral Daily  . aspirin EC  81 mg Oral Daily  . baricitinib  4 mg Oral Daily  . cholecalciferol  1,000 Units Oral Daily  . enoxaparin (LOVENOX) injection  40 mg Subcutaneous Q24H  . famotidine  20 mg Oral BID  . guaiFENesin  600 mg Oral BID  . insulin aspart  0-15 Units Subcutaneous TID WC  . linagliptin  5 mg Oral Daily  . methylPREDNISolone (SOLU-MEDROL) injection  40 mg  Intravenous Q12H  . Ensure Max Protein  11 oz Oral BID BM  . [START ON 05/08/2020] Vitamin D (Ergocalciferol)  50,000 Units Oral Q7 days  . zinc sulfate  220 mg Oral Daily   Continuous Infusions: . remdesivir 100 mg in NS 100 mL 100 mg (05/05/20 1058)    Principal Problem:   Acute hypoxemic respiratory failure due to COVID-19 [facility]) Active Problems:   Pneumonia due to 2019 novel coronavirus   Prediabetes   Steroid-induced hyperglycemia   Obesity (BMI 35.0-39.9 without comorbidity)   Elevated transaminase level   LOS: 3 days   How to contact the [facility] Attending or Consulting provider 7A -  7P or covering provider during after hours 7P -7A, for this patient?  1. Check the care team in [facility] and look for a) attending/consulting TRH provider listed and b) the [facility] team listed 2. Log into [email or web address] and use [place]'s universal password to access. If you do not have the password, please contact the hospital operator. 3. Locate the [facility] provider you are looking for under [facility] and page to a number that you can be directly reached. 4. If you still have difficulty reaching the provider, please page the [facility] (Director on Call) for the Hospitalists listed on amion for assistance.

## 2020-05-05 NOTE — Assessment & Plan Note (Addendum)
--  mild elevation AST is stable --expect spontaneous resolution

## 2020-05-06 LAB — CBC WITH DIFFERENTIAL/PLATELET
Abs Immature Granulocytes: 1.37 10*3/uL — ABNORMAL HIGH (ref 0.00–0.07)
Basophils Absolute: 0.1 10*3/uL (ref 0.0–0.1)
Basophils Relative: 1 %
Eosinophils Absolute: 0 10*3/uL (ref 0.0–0.5)
Eosinophils Relative: 0 %
HCT: 39.7 % (ref 39.0–52.0)
Hemoglobin: 13.5 g/dL (ref 13.0–17.0)
Immature Granulocytes: 11 %
Lymphocytes Relative: 16 %
Lymphs Abs: 1.9 10*3/uL (ref 0.7–4.0)
MCH: 29.3 pg (ref 26.0–34.0)
MCHC: 34 g/dL (ref 30.0–36.0)
MCV: 86.1 fL (ref 80.0–100.0)
Monocytes Absolute: 0.8 10*3/uL (ref 0.1–1.0)
Monocytes Relative: 7 %
Neutro Abs: 8 10*3/uL — ABNORMAL HIGH (ref 1.7–7.7)
Neutrophils Relative %: 65 %
Platelets: 355 10*3/uL (ref 150–400)
RBC: 4.61 MIL/uL (ref 4.22–5.81)
RDW: 12.9 % (ref 11.5–15.5)
WBC: 12.1 10*3/uL — ABNORMAL HIGH (ref 4.0–10.5)
nRBC: 0.2 % (ref 0.0–0.2)

## 2020-05-06 LAB — GLUCOSE, CAPILLARY
Glucose-Capillary: 135 mg/dL — ABNORMAL HIGH (ref 70–99)
Glucose-Capillary: 119 mg/dL — ABNORMAL HIGH (ref 70–99)

## 2020-05-06 LAB — COMPREHENSIVE METABOLIC PANEL
ALT: 71 U/L — ABNORMAL HIGH (ref 0–44)
AST: 34 U/L (ref 15–41)
Albumin: 3.4 g/dL — ABNORMAL LOW (ref 3.5–5.0)
Alkaline Phosphatase: 53 U/L (ref 38–126)
Anion gap: 10 (ref 5–15)
BUN: 22 mg/dL — ABNORMAL HIGH (ref 6–20)
CO2: 27 mmol/L (ref 22–32)
Calcium: 8.6 mg/dL — ABNORMAL LOW (ref 8.9–10.3)
Chloride: 103 mmol/L (ref 98–111)
Creatinine, Ser: 1.1 mg/dL (ref 0.61–1.24)
GFR calc Af Amer: >60 mL/min (ref >60)
GFR calc non Af Amer: >60 mL/min (ref >60)
Glucose, Bld: 159 mg/dL — ABNORMAL HIGH (ref 70–99)
Potassium: 4.4 mmol/L (ref 3.5–5.1)
Sodium: 140 mmol/L (ref 135–145)
Total Bilirubin: 0.9 mg/dL (ref 0.3–1.2)
Total Protein: 6.8 g/dL (ref 6.5–8.1)

## 2020-05-06 LAB — FERRITIN: Ferritin: 565 ng/mL — ABNORMAL HIGH (ref 24–336)

## 2020-05-06 LAB — FIBRIN DERIVATIVES D-DIMER (ARMC ONLY): Fibrin derivatives D-dimer (ARMC): 784.95 ng/mL (FEU) — ABNORMAL HIGH (ref 0.00–499.00)

## 2020-05-06 LAB — C-REACTIVE PROTEIN: CRP: 0.8 mg/dL (ref <1.0)

## 2020-05-06 MED ORDER — ASCORBIC ACID 500 MG PO TABS: 500.0000 mg | ORAL_TABLET | Freq: Every day | ORAL | Status: AC

## 2020-05-06 MED ORDER — PREDNISONE 10 MG PO TABS: ORAL_TABLET | ORAL | 0 refills | Status: AC

## 2020-05-06 MED ORDER — ZINC SULFATE 220 (50 ZN) MG PO CAPS: 220.0000 mg | ORAL_CAPSULE | Freq: Every day | ORAL | Status: AC

## 2020-05-06 NOTE — Discharge Summary (Signed)
Physician Discharge Summary  Jared Werner WCH:[id number] DOB: Apr 02, 1986 DOA: 05/02/2020  PCP: Jared Bosch, NP  Admit date: 05/02/2020 Discharge date: 05/06/2020  Recommendations for Outpatient Follow-up:  1. Hypoxia secondary to COVID pneumonia, discharged on home oxygen   Follow-up Information    [place], Jared Werner, DC .   Specialty: Chiropractic Medicine Contact information: [address] [place] [address] [phone]               Discharge Diagnoses: Principal diagnosis is #1 Principal Problem:   Acute hypoxemic respiratory failure due to COVID-19 [facility]) Active Problems:   Pneumonia due to 2019 novel coronavirus   Prediabetes   Steroid-induced hyperglycemia   Obesity (BMI 35.0-39.9 without comorbidity)   Elevated transaminase level   Discharge Condition: improved Disposition: home  Diet recommendation: diabetic diet  Filed Weights   05/02/20 1508  Weight: 125.2 kg    History of present illness:  33yow PMH obesity, prediabetes presented with generalized weakness, anosmia, cough, fever, loss of taste. Admitted for acute hypoxic respiratory failure secondary to COVID-19 multifocal pneumonia.   Hospital Course:  Treated with standard therapy for Covid pneumonia with gradual clinical improvement, near resolution of hypoxia.  Will go home on oxygen likely short-term.  Individual issues as below. Acute hypoxemic respiratory failure due to COVID-19 [facility]) --much better today; hypoxia nearly resolved --admit CXR showed extensive multifocal pneumonia --oxygen RA 90% --inflammatory markers . Ferritin 713 > 866 > 757 > 565 . CRP 12.8 > 4.1 > 1.6 > 0.8  . Fibrin derivatives 1152 > 658 > 784 --Tx . Remdesiver 8/21 > 8/25. Arranged to get last dose of remdesivir at [facility] outpatient infusion center 8/25 . Steroids 8/20 > taper . baricitinim 8/20 > stopped on discharge  Pneumonia due to 2019 novel coronavirus --Plan as above  Prediabetes --Hemoglobin A1c  5.9 --Complicated by steroid-induced hyperglycemia --CBG remained stable, resume metformin on discharge.  Steroid-induced hyperglycemia --See prediabetes plan  Obesity (BMI 35.0-39.9 without comorbidity) --Dietitian consult appreciated  Elevated transaminase level --mild elevation AST is stable --expect spontaneous resolution  Today's assessment: S: feels much better, breathing better O: Vitals:  Vitals:   05/06/20 0018 05/06/20 0603  BP: (!) 92/40 112/61  Pulse: 63 (!) 53  Resp: 16 17  Temp: 98.1 F (36.7 C) 98.1 F (36.7 C)  SpO2: 96% 98%    Constitutional:  . Appears calm and comfortable ENMT:  . grossly normal hearing  Respiratory:  . CTA bilaterally, no w/r/r.  . Respiratory effort normal. Cardiovascular:  . RRR, no m/r/g Psychiatric:  . Mental status o Mood, affect appropriate  CBG stable ALT mild elevation at 71 CBC unremarkable  Discharge Instructions  Discharge Instructions    Diet Carb Modified   Complete by: As directed    Discharge instructions   Complete by: As directed    Call your physician or seek immediate medical attention for fever, shortness of breath, wheezing or worsening of condition.   Increase activity slowly   Complete by: As directed      Allergies as of 05/06/2020   No Known Allergies     Medication List    TAKE these medications   ascorbic acid 500 MG tablet Commonly known as: VITAMIN C Take 1 tablet (500 mg total) by mouth daily. Start taking on: May 07, 2020   metFORMIN 1000 MG tablet Commonly known as: GLUCOPHAGE Take 1,000 mg by mouth daily.   multivitamin with minerals tablet Take 1 tablet by mouth daily.   predniSONE 10 MG tablet  Commonly known as: DELTASONE Take 4 tablets (40 mg total) by mouth daily for 3 days, THEN 2 tablets (20 mg total) daily for 3 days, THEN 1 tablet (10 mg total) daily for 3 days. Start taking on: May 06, 2020   SILDENAFIL CITRATE PO Take 30-60 mg by mouth daily as needed  (ED symptoms).   zinc sulfate 220 (50 Zn) MG capsule Take 1 capsule (220 mg total) by mouth daily. Start taking on: May 07, 2020            Durable Medical Equipment  (From admission, onward)         Start     Ordered   05/06/20 1320  For home use only DME oxygen  Once       Question Answer Comment  Length of Need 6 Months   Mode or (Route) Nasal cannula   Liters per Minute 2   Frequency Continuous (stationary and portable oxygen unit needed)   Oxygen delivery system Gas      05/06/20 1319         No Known Allergies  The results of significant diagnostics from this hospitalization (including imaging, microbiology, ancillary and laboratory) are listed below for reference.    Significant Diagnostic Studies: DG Chest 2 View  Result Date: 05/02/2020 CLINICAL DATA:  The patient tested positive for COVID-19 1 week ago. Fever, chills, diarrhea and shortness of breath. EXAM: CHEST - 2 VIEW COMPARISON:  None. FINDINGS: Extensive bilateral airspace disease is consistent with pneumonia. Heart size is normal. No pneumothorax or pleural effusion. IMPRESSION: Extensive multifocal pneumonia. Electronically Signed   By: Drusilla Kanner M.D.   On: 05/02/2020 15:53     Labs: Basic Metabolic Panel: Recent Labs  Lab 05/02/20 1518 05/03/20 0301 05/04/20 0726 05/05/20 0650 05/06/20 0537  NA 139 137 138 141 140  K 3.8 3.8 3.8 4.7 4.4  CL 104 102 105 105 103  CO2 26 24 20* 25 27  GLUCOSE 105* 220* 140* 157* 159*  BUN 10 12 15  22* 22*  CREATININE 0.88 0.68 0.71 0.94 1.10  CALCIUM 8.1* 8.1* 8.7* 8.8* 8.6*   Liver Function Tests: Recent Labs  Lab 05/02/20 1518 05/03/20 0301 05/04/20 0726 05/05/20 0650 05/06/20 0537  AST 38 33 33 41 34  ALT 31 30 32 68* 71*  ALKPHOS 60 56 53 54 53  BILITOT 0.8 0.8 0.8 0.7 0.9  PROT 7.8 7.4 7.2 6.9 6.8  ALBUMIN 3.9 3.5 3.5 3.4* 3.4*   CBC: Recent Labs  Lab 05/02/20 1518 05/03/20 0301 05/04/20 0852 05/05/20 0650 05/06/20 0537   WBC 5.0 3.4* 6.4 9.7 12.1*  NEUTROABS 3.6 2.6 4.5 6.6 8.0*  HGB 13.5 13.1 13.2 13.2 13.5  HCT 38.9* 37.6* 37.6* 38.2* 39.7  MCV 84.7 83.9 81.9 85.7 86.1  PLT 175 190 270 329 355    Recent Labs    05/03/20 0301  BNP 38.6   CBG: Recent Labs  Lab 05/05/20 1223 05/05/20 1556 05/05/20 2205 05/06/20 0720 05/06/20 1230  GLUCAP 151* 217* 116* 135* 119*    Principal Problem:   Acute hypoxemic respiratory failure due to COVID-19 [facility]) Active Problems:   Pneumonia due to 2019 novel coronavirus   Prediabetes   Steroid-induced hyperglycemia   Obesity (BMI 35.0-39.9 without comorbidity)   Elevated transaminase level   Time coordinating discharge: 25 minutes  Signed:  08-13-1990, MD  [facility]  05/06/2020, 5:15 PM

## 2020-05-06 NOTE — Progress Notes (Signed)
Pt discharged via private vehicle. Discharge instructions explained and given to pt. O2 delivered to pt's room. No further questions at this time. No s/s of distress noted, IV removed.

## 2020-05-06 NOTE — Progress Notes (Signed)
SATURATION QUALIFICATIONS: (This note is used to comply with regulatory documentation for home oxygen)  Patient Saturations on Room Air at Rest = 96%  Patient Saturations on Room Air while Ambulating = 90%  Patient Saturations on 2 Liters of oxygen while Ambulating = 94%  Please briefly explain why patient needs home oxygen:  

## 2020-05-06 NOTE — TOC Initial Note (Signed)
Transition of Care [facility]) - Initial/Assessment Note    Patient Details  Name: Jared Werner MRN: [id number] Date of Birth: 07-09-1986  Transition of [facility] CM/SW Contact:    Allayne Butcher, RN Phone Number: 05/06/2020, 1:28 PM  Clinical Narrative:                 Patient is medically stable for discharge home.  Patient will follow up with the OP infusion clinic at [facility] for his last dose of Remdesivir tomorrow.  Patient will go home with home Oxygen 2L- concentrator will be delivered to the room by [employer] by 1430 this afternoon.  Patient's wife will be picking him up today.  Patient is independent in ADL's.   No other needs identified.   Expected Discharge Plan: Home/Self Care Barriers to Discharge: Barriers Resolved   Patient Goals and CMS Choice Patient states their goals for this hospitalization and ongoing recovery are:: Glad to be going home today CMS Medicare.gov Compare Post Acute Care list provided to:: Patient Choice offered to / list presented to : Patient  Expected Discharge Plan and Services Expected Discharge Plan: Home/Self Care   Discharge Planning Services: CM Consult Post Acute Care Choice: Durable Medical Equipment Living arrangements for the past 2 months: Single Family Home Expected Discharge Date: 05/06/20               DME Arranged: Oxygen DME Agency: [employer] Date DME Agency Contacted: 05/06/20 Time DME Agency Contacted: 1320 Representative spoke with at DME Agency: Ian Malkin HH Arranged: NA          Prior Living Arrangements/Services Living arrangements for the past 2 months: Single Family Home Lives with:: Spouse Patient language and need for interpreter reviewed:: Yes (English) Do you feel safe going back to the place where you live?: Yes      Need for Family Participation in Patient Care: Yes (Comment) (COVID) Care giver support system in place?: Yes (comment) (wife)   Criminal Activity/Legal Involvement Pertinent to Current  Situation/Hospitalization: No - Comment as needed  Activities of Daily Living Home Assistive Devices/Equipment: None ADL Screening (condition at time of admission) Patient's cognitive ability adequate to safely complete daily activities?: Yes Is the patient deaf or have difficulty hearing?: No Does the patient have difficulty seeing, even when wearing glasses/contacts?: No Does the patient have difficulty concentrating, remembering, or making decisions?: No Patient able to express need for assistance with ADLs?: Yes Does the patient have difficulty dressing or bathing?: No Independently performs ADLs?: No Communication: Independent Dressing (OT): Independent Grooming: Independent Feeding: Independent Bathing: Independent Toileting: Independent In/Out Bed: Independent Walks in Home: Independent Does the patient have difficulty walking or climbing stairs?: No Weakness of Legs: None Weakness of Arms/Hands: None  Permission Sought/Granted Permission sought to share information with : Case Manager, Other (comment) Permission granted to share information with : Yes, Verbal Permission Granted     Permission granted to share info w AGENCY: [employer]        Emotional Assessment   Attitude/Demeanor/Rapport: Engaged Affect (typically observed): Accepting Orientation: : Oriented to Self, Oriented to Place, Oriented to  Time, Oriented to Situation Alcohol / Substance Use: Not Applicable Psych Involvement: No (comment)  Admission diagnosis:  Shortness of breath [R06.02] Hypoxia [R09.02] Fever in other diseases [R50.81] Pneumonia due to 2019 novel coronavirus [U07.1, J12.82] COVID-19 [U07.1] Patient Active Problem List   Diagnosis Date Noted  . Elevated transaminase level 05/05/2020  . Acute hypoxemic respiratory failure due to COVID-19 (HCC) 05/03/2020  .  Prediabetes 05/03/2020  . Steroid-induced hyperglycemia 05/03/2020  . Obesity (BMI 35.0-39.9 without comorbidity) 05/03/2020  .  Pneumonia due to 2019 novel coronavirus 05/02/2020   PCP:  Lance Bosch, NP Pharmacy:   [facility] [phone] - [address] [place] - [address] [place] [address] Phone: [phone] Fax: [phone]     Social Determinants of Health (SDOH) Interventions    Readmission Risk Interventions No flowsheet data found.

## 2020-05-06 NOTE — Discharge Instructions (Signed)
Patient scheduled for outpatient Remdesivir infusion at 3pm, Wednesday 8/25 at [facility]. Please inform the patient to park at [address] [place], [place], as staff will be escorting the patient through the east entrance of the hospital.    There is a wave flag banner located near the entrance on [address]. Turn into this entrance and immediately turn left and park in 1 of the 5 designated Covid Infusion Parking spots. There is a phone number on the sign, please call and let the staff know what spot you are in and we will come out and get you. For questions call [phone].  Thanks.    Please take Tylenol and ibuprofen/Advil for your pain.  It is safe to take them together, or to alternate them every few hours.  Take up to 1000mg  of Tylenol at a time, up to 4 times per day.  Do not take more than 4000 mg of Tylenol in 24 hours.  For ibuprofen, take 400-600 mg, 4-5 times per day.  You may also use Mucinex.  I would recommend buying the regular extra strength Mucinex without the cough suppressant addition.  Continue to push fluids to maintain your hydration.  It is okay if you do not eat solid food for couple days.   Please buy a finger pulse oximeter to measure your oxygen levels at home 1 or 2 times per day. The Magic cutoff is 90%.  If you dipped below 90 into the 80s temporarily, take some deep breaths and if this resolves then it is safe.  If your oxygen level stay below 90% for 10-15 minutes, please return to the ED.

## 2020-05-06 NOTE — Progress Notes (Signed)
Patient scheduled for outpatient Remdesivir infusion at 3pm, Wednesday 8/25 at [facility]. Please inform the patient to park at [address] [place], [place], as staff will be escorting the patient through the east entrance of the hospital.    There is a wave flag banner located near the entrance on [address]. Turn into this entrance and immediately turn left and park in 1 of the 5 designated Covid Infusion Parking spots. There is a phone number on the sign, please call and let the staff know what spot you are in and we will come out and get you. For questions call [phone].  Thanks.

## 2020-05-07 ENCOUNTER — Ambulatory Visit (HOSPITAL_COMMUNITY)
Admit: 2020-05-07 | Discharge: 2020-05-07 | Disposition: A | Discharge status: Home / Self Care | Payer: Managed Care, Other (non HMO) | Attending: Pulmonary Disease | Admitting: Pulmonary Disease

## 2020-05-07 DIAGNOSIS — U071 COVID-19: Secondary | ICD-10-CM | POA: Diagnosis present

## 2020-05-07 DIAGNOSIS — J1282 Pneumonia due to coronavirus disease 2019: Secondary | ICD-10-CM | POA: Diagnosis not present

## 2020-05-07 MED ORDER — FAMOTIDINE IN NACL 20-0.9 MG/50ML-% IV SOLN: 20.0000 mg | Freq: Once | INTRAVENOUS | Status: DC | PRN

## 2020-05-07 MED ORDER — SODIUM CHLORIDE 0.9 % IV SOLN: INTRAVENOUS | Status: DC | PRN

## 2020-05-07 MED ORDER — EPINEPHRINE 0.3 MG/0.3ML IJ SOAJ: 0.3000 mg | Freq: Once | INTRAMUSCULAR | Status: DC | PRN

## 2020-05-07 MED ORDER — SODIUM CHLORIDE 0.9 % IV SOLN
100.0000 mg | Freq: Once | INTRAVENOUS | Status: AC
  Administered 2020-05-07: 100 mg via INTRAVENOUS
  Filled 2020-05-07: qty 20

## 2020-05-07 MED ORDER — ALBUTEROL SULFATE HFA 108 (90 BASE) MCG/ACT IN AERS: 2.0000 | INHALATION_SPRAY | Freq: Once | RESPIRATORY_TRACT | Status: DC | PRN

## 2020-05-07 MED ORDER — METHYLPREDNISOLONE SODIUM SUCC 125 MG IJ SOLR: 125.0000 mg | Freq: Once | INTRAMUSCULAR | Status: DC | PRN

## 2020-05-07 MED ORDER — DIPHENHYDRAMINE HCL 50 MG/ML IJ SOLN: 50.0000 mg | Freq: Once | INTRAMUSCULAR | Status: DC | PRN

## 2020-05-07 NOTE — Progress Notes (Signed)
  Diagnosis: COVID-19  Physician: Dr. Delford Field   Procedure: Covid Infusion Clinic Med: remdesivir infusion - Provided patient with remdesivir fact sheet for patients, parents and caregivers prior to infusion.  Complications: No immediate complications noted.  Discharge: Discharged home   Arthor Captain 05/07/2020

## 2020-05-07 NOTE — Discharge Instructions (Signed)
10 Things You Can Do to Manage Your COVID-19 Symptoms at Home If you have possible or confirmed COVID-19: 1. Stay home from work and school. And stay away from other public places. If you must go out, avoid using any kind of public transportation, ridesharing, or taxis. 2. Monitor your symptoms carefully. If your symptoms get worse, call your healthcare provider immediately. 3. Get rest and stay hydrated. 4. If you have a medical appointment, call the healthcare provider ahead of time and tell them that you have or may have COVID-19. 5. For medical emergencies, call 911 and notify the dispatch personnel that you have or may have COVID-19. 6. Cover your cough and sneezes with a tissue or use the inside of your elbow. 7. Wash your hands often with soap and water for at least 20 seconds or clean your hands with an alcohol-based hand sanitizer that contains at least 60% alcohol. 8. As much as possible, stay in a specific room and away from other people in your home. Also, you should use a separate bathroom, if available. If you need to be around other people in or outside of the home, wear a mask. 9. Avoid sharing personal items with other people in your household, like dishes, towels, and bedding. 10. Clean all surfaces that are touched often, like counters, tabletops, and doorknobs. Use household cleaning sprays or wipes according to the label instructions. cdc.gov/coronavirus 03/14/2019 This information is not intended to replace advice given to you by your health care provider. Make sure you discuss any questions you have with your health care provider. Document Revised: 08/16/2019 Document Reviewed: 08/16/2019 Elsevier Patient Education  2020 [employer] Inc[employer]  

## 2021-07-14 IMAGING — CR DG CHEST 2V
1 series · 2 of 2 positions shown · non-contrast
Comparison: None.

CLINICAL DATA: The patient tested positive for U3WTM-LD 1 week ago.
Fever, chills, diarrhea and shortness of breath.

EXAM:
CHEST - 2 VIEW

[Series 1: w chest pa · 0.14mm/px · 2 of 2 slices shown]
[im 1/2]
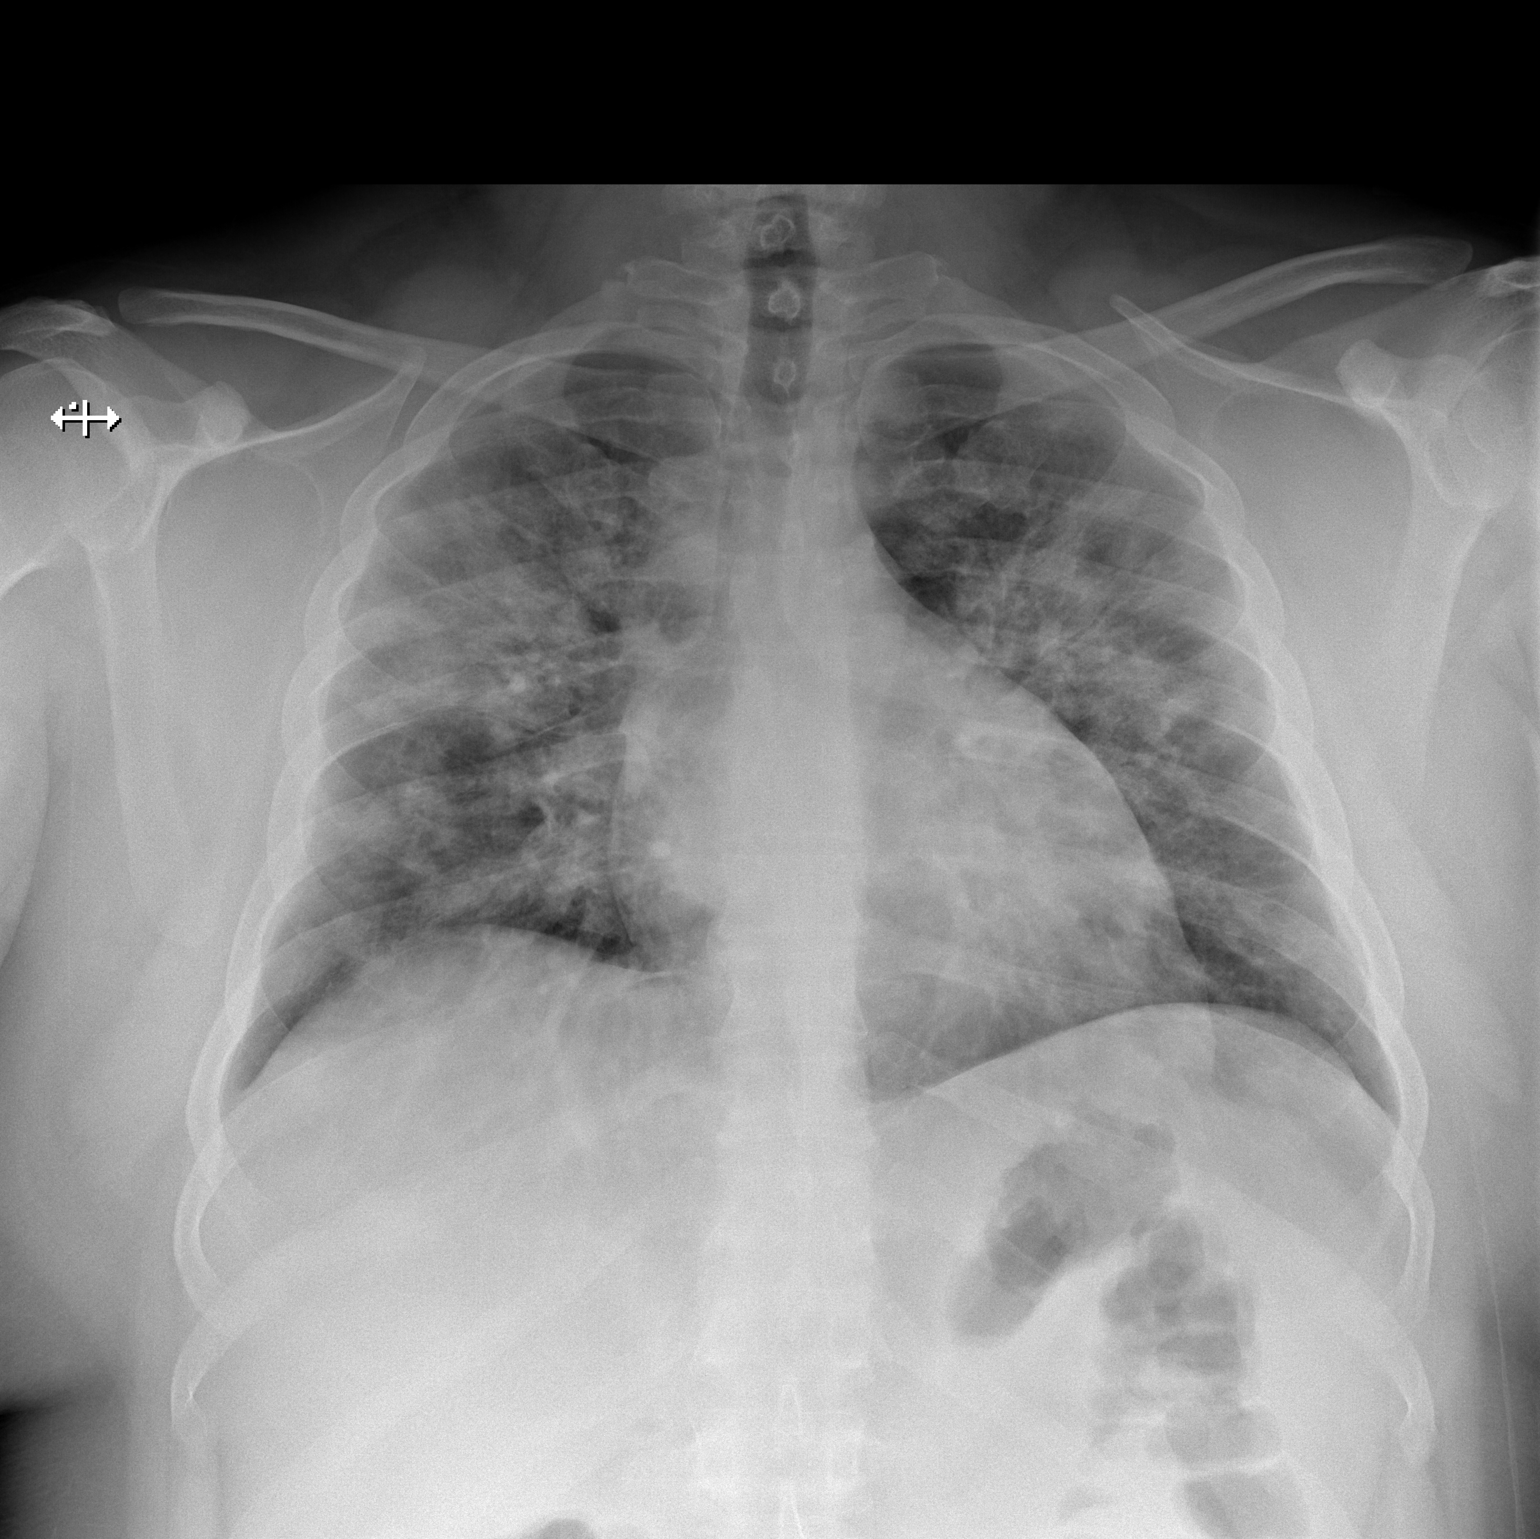
[im 2/2]
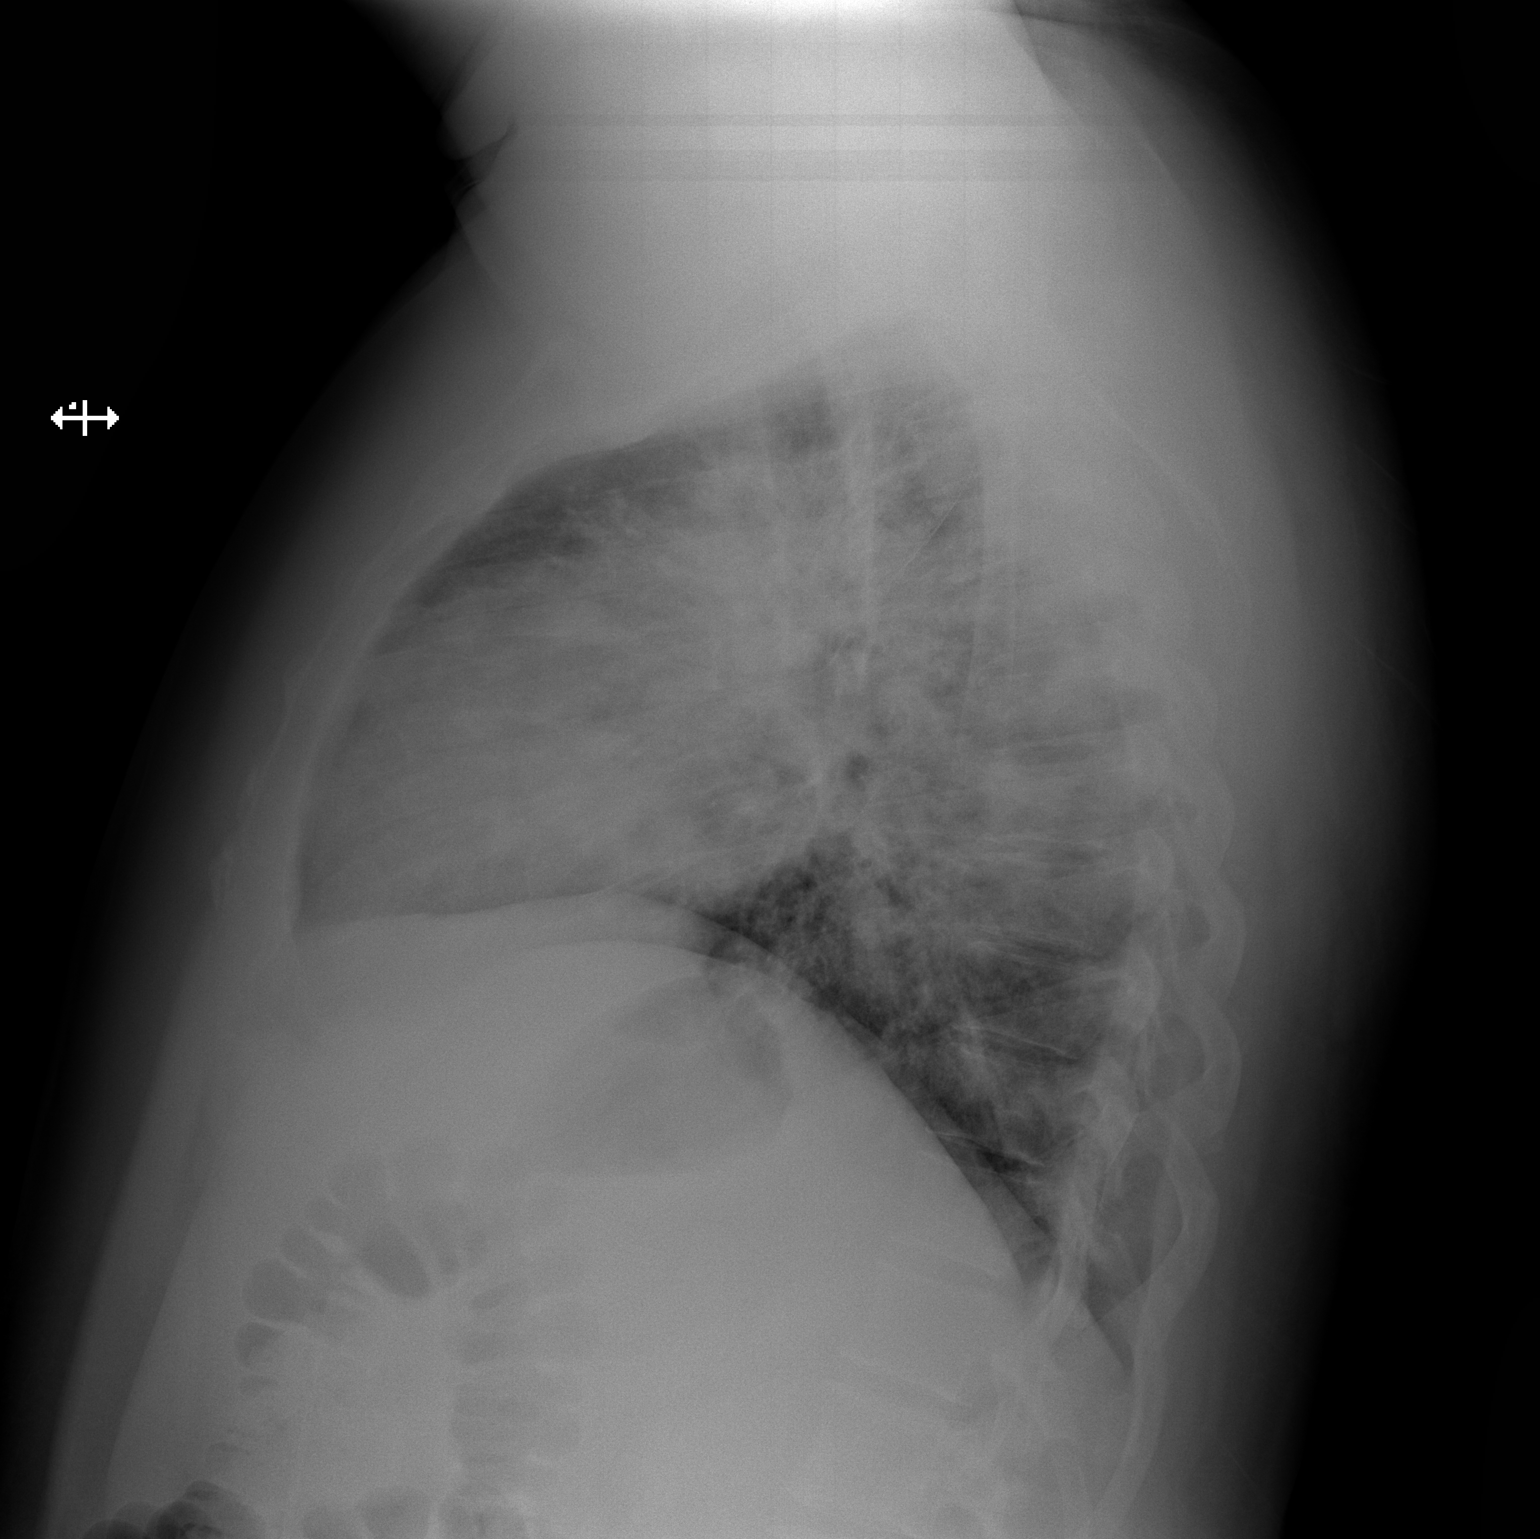

[2 of 2 positions shown; findings below may reference images not displayed]

FINDINGS: Extensive bilateral airspace disease is consistent with pneumonia.
Heart size is normal. No pneumothorax or pleural effusion.
IMPRESSION: Extensive multifocal pneumonia.

## 2022-04-21 ENCOUNTER — Encounter (INDEPENDENT_AMBULATORY_CARE_PROVIDER_SITE_OTHER): Payer: Self-pay
